# Patient Record
Sex: Female | Born: 1970 | Race: White | Hispanic: No | Marital: Married | Smoking: Never smoker
Health system: Southern US, Community
[De-identification: ages and names within clinical notes are randomized; demographics above are authoritative.]

## PROBLEM LIST (undated history)

## (undated) DIAGNOSIS — E871 Hypo-osmolality and hyponatremia: Secondary | ICD-10-CM

## (undated) DIAGNOSIS — R569 Unspecified convulsions: Secondary | ICD-10-CM

## (undated) DIAGNOSIS — F1010 Alcohol abuse, uncomplicated: Secondary | ICD-10-CM

## (undated) DIAGNOSIS — F32A Depression, unspecified: Secondary | ICD-10-CM

---

## 2021-05-04 ENCOUNTER — Inpatient Hospital Stay (HOSPITAL_COMMUNITY)
Admission: EM | Admit: 2021-05-04 | Discharge: 2021-05-08 | DRG: 082 | Disposition: A | Discharge status: Rehabilitation Facility | Payer: Federal, State, Local not specified - PPO | Attending: Neurosurgery | Admitting: Neurosurgery

## 2021-05-04 ENCOUNTER — Emergency Department (HOSPITAL_COMMUNITY): Payer: Federal, State, Local not specified - PPO

## 2021-05-04 ENCOUNTER — Observation Stay (HOSPITAL_COMMUNITY): Payer: Federal, State, Local not specified - PPO

## 2021-05-04 ENCOUNTER — Encounter (HOSPITAL_COMMUNITY): Payer: Self-pay | Admitting: Neurosurgery

## 2021-05-04 ENCOUNTER — Other Ambulatory Visit: Payer: Self-pay

## 2021-05-04 ENCOUNTER — Observation Stay: Payer: Self-pay

## 2021-05-04 DIAGNOSIS — R569 Unspecified convulsions: Secondary | ICD-10-CM | POA: Diagnosis present

## 2021-05-04 DIAGNOSIS — E440 Moderate protein-calorie malnutrition: Secondary | ICD-10-CM | POA: Insufficient documentation

## 2021-05-04 DIAGNOSIS — S06364A Traumatic hemorrhage of cerebrum, unspecified, with loss of consciousness of 6 hours to 24 hours, initial encounter: Secondary | ICD-10-CM | POA: Diagnosis not present

## 2021-05-04 DIAGNOSIS — E222 Syndrome of inappropriate secretion of antidiuretic hormone: Secondary | ICD-10-CM | POA: Diagnosis present

## 2021-05-04 DIAGNOSIS — E871 Hypo-osmolality and hyponatremia: Secondary | ICD-10-CM

## 2021-05-04 DIAGNOSIS — F10231 Alcohol dependence with withdrawal delirium: Secondary | ICD-10-CM | POA: Diagnosis present

## 2021-05-04 DIAGNOSIS — E876 Hypokalemia: Secondary | ICD-10-CM | POA: Diagnosis present

## 2021-05-04 DIAGNOSIS — F32A Depression, unspecified: Secondary | ICD-10-CM | POA: Diagnosis present

## 2021-05-04 DIAGNOSIS — S02119A Unspecified fracture of occiput, initial encounter for closed fracture: Secondary | ICD-10-CM | POA: Diagnosis present

## 2021-05-04 DIAGNOSIS — I619 Nontraumatic intracerebral hemorrhage, unspecified: Secondary | ICD-10-CM | POA: Diagnosis present

## 2021-05-04 DIAGNOSIS — Z6829 Body mass index (BMI) 29.0-29.9, adult: Secondary | ICD-10-CM

## 2021-05-04 DIAGNOSIS — Z885 Allergy status to narcotic agent status: Secondary | ICD-10-CM

## 2021-05-04 DIAGNOSIS — S06341A Traumatic hemorrhage of right cerebrum with loss of consciousness of 30 minutes or less, initial encounter: Secondary | ICD-10-CM

## 2021-05-04 DIAGNOSIS — F10229 Alcohol dependence with intoxication, unspecified: Secondary | ICD-10-CM | POA: Diagnosis present

## 2021-05-04 DIAGNOSIS — T43224A Poisoning by selective serotonin reuptake inhibitors, undetermined, initial encounter: Secondary | ICD-10-CM | POA: Diagnosis present

## 2021-05-04 DIAGNOSIS — W1830XA Fall on same level, unspecified, initial encounter: Secondary | ICD-10-CM | POA: Diagnosis present

## 2021-05-04 DIAGNOSIS — Y906 Blood alcohol level of 120-199 mg/100 ml: Secondary | ICD-10-CM | POA: Diagnosis present

## 2021-05-04 DIAGNOSIS — G9341 Metabolic encephalopathy: Secondary | ICD-10-CM | POA: Diagnosis present

## 2021-05-04 DIAGNOSIS — Z880 Allergy status to penicillin: Secondary | ICD-10-CM

## 2021-05-04 DIAGNOSIS — Y9301 Activity, walking, marching and hiking: Secondary | ICD-10-CM | POA: Diagnosis present

## 2021-05-04 DIAGNOSIS — S0634AA Traumatic hemorrhage of right cerebrum with loss of consciousness status unknown, initial encounter: Secondary | ICD-10-CM | POA: Diagnosis not present

## 2021-05-04 HISTORY — DX: Alcohol abuse, uncomplicated: F10.10

## 2021-05-04 HISTORY — DX: Unspecified convulsions: R56.9

## 2021-05-04 HISTORY — DX: Hypo-osmolality and hyponatremia: E87.1

## 2021-05-04 HISTORY — DX: Depression, unspecified: F32.A

## 2021-05-04 LAB — OSMOLALITY, URINE: Osmolality, Ur: 439 mOsm/kg (ref 300–900)

## 2021-05-04 LAB — COMPREHENSIVE METABOLIC PANEL
ALT: 20 U/L (ref 0–44)
AST: 29 U/L (ref 15–41)
Albumin: 4.3 g/dL (ref 3.5–5.0)
Alkaline Phosphatase: 89 U/L (ref 38–126)
Anion gap: 19 — ABNORMAL HIGH (ref 5–15)
BUN: 6 mg/dL (ref 6–20)
CO2: 19 mmol/L — ABNORMAL LOW (ref 22–32)
Calcium: 8.7 mg/dL — ABNORMAL LOW (ref 8.9–10.3)
Chloride: 79 mmol/L — ABNORMAL LOW (ref 98–111)
Creatinine, Ser: 0.5 mg/dL (ref 0.44–1.00)
GFR, Estimated: >60 mL/min (ref >60)
Glucose, Bld: 161 mg/dL — ABNORMAL HIGH (ref 70–99)
Potassium: 3.2 mmol/L — ABNORMAL LOW (ref 3.5–5.1)
Sodium: 117 mmol/L — CL (ref 135–145)
Total Bilirubin: 0.9 mg/dL (ref 0.3–1.2)
Total Protein: 6.8 g/dL (ref 6.5–8.1)

## 2021-05-04 LAB — CBC WITH DIFFERENTIAL/PLATELET
Abs Immature Granulocytes: 0.14 10*3/uL — ABNORMAL HIGH (ref 0.00–0.07)
Basophils Absolute: 0.1 10*3/uL (ref 0.0–0.1)
Basophils Relative: 1 %
Eosinophils Absolute: 0.3 10*3/uL (ref 0.0–0.5)
Eosinophils Relative: 2 %
HCT: 35.8 % — ABNORMAL LOW (ref 36.0–46.0)
Hemoglobin: 12.9 g/dL (ref 12.0–15.0)
Immature Granulocytes: 1 %
Lymphocytes Relative: 19 %
Lymphs Abs: 2.3 10*3/uL (ref 0.7–4.0)
MCH: 31.4 pg (ref 26.0–34.0)
MCHC: 36 g/dL (ref 30.0–36.0)
MCV: 87.1 fL (ref 80.0–100.0)
Monocytes Absolute: 0.9 10*3/uL (ref 0.1–1.0)
Monocytes Relative: 7 %
Neutro Abs: 8.2 10*3/uL — ABNORMAL HIGH (ref 1.7–7.7)
Neutrophils Relative %: 70 %
Platelets: 304 10*3/uL (ref 150–400)
RBC: 4.11 MIL/uL (ref 3.87–5.11)
RDW: 11.9 % (ref 11.5–15.5)
WBC: 11.9 10*3/uL — ABNORMAL HIGH (ref 4.0–10.5)
nRBC: 0 % (ref 0.0–0.2)

## 2021-05-04 LAB — BASIC METABOLIC PANEL
Anion gap: 11 (ref 5–15)
Anion gap: 13 (ref 5–15)
BUN: <5 mg/dL — ABNORMAL LOW (ref 6–20)
BUN: 5 mg/dL — ABNORMAL LOW (ref 6–20)
CO2: 23 mmol/L (ref 22–32)
CO2: 22 mmol/L (ref 22–32)
Calcium: 8.9 mg/dL (ref 8.9–10.3)
Calcium: 8.9 mg/dL (ref 8.9–10.3)
Chloride: 79 mmol/L — ABNORMAL LOW (ref 98–111)
Chloride: 79 mmol/L — ABNORMAL LOW (ref 98–111)
Creatinine, Ser: 0.43 mg/dL — ABNORMAL LOW (ref 0.44–1.00)
Creatinine, Ser: 0.53 mg/dL (ref 0.44–1.00)
GFR, Estimated: >60 mL/min (ref >60)
GFR, Estimated: >60 mL/min (ref >60)
Glucose, Bld: 145 mg/dL — ABNORMAL HIGH (ref 70–99)
Glucose, Bld: 153 mg/dL — ABNORMAL HIGH (ref 70–99)
Potassium: 3.9 mmol/L (ref 3.5–5.1)
Potassium: 4.4 mmol/L (ref 3.5–5.1)
Sodium: 113 mmol/L — CL (ref 135–145)
Sodium: 114 mmol/L — CL (ref 135–145)

## 2021-05-04 LAB — MRSA NEXT GEN BY PCR, NASAL: MRSA by PCR Next Gen: NOT DETECTED

## 2021-05-04 LAB — SODIUM
Sodium: 114 mmol/L — CL (ref 135–145)
Sodium: 116 mmol/L — CL (ref 135–145)
Sodium: 117 mmol/L — CL (ref 135–145)

## 2021-05-04 LAB — RAPID URINE DRUG SCREEN, HOSP PERFORMED
Amphetamines: NOT DETECTED
Barbiturates: NOT DETECTED
Benzodiazepines: NOT DETECTED
Cocaine: NOT DETECTED
Opiates: NOT DETECTED
Tetrahydrocannabinol: NOT DETECTED

## 2021-05-04 LAB — HIV ANTIBODY (ROUTINE TESTING W REFLEX): HIV Screen 4th Generation wRfx: NONREACTIVE

## 2021-05-04 LAB — ETHANOL: Alcohol, Ethyl (B): 130 mg/dL — ABNORMAL HIGH (ref <10)

## 2021-05-04 LAB — MAGNESIUM: Magnesium: 1.4 mg/dL — ABNORMAL LOW (ref 1.7–2.4)

## 2021-05-04 LAB — OSMOLALITY: Osmolality: 237 mOsm/kg — CL (ref 275–295)

## 2021-05-04 LAB — GLUCOSE, CAPILLARY: Glucose-Capillary: 130 mg/dL — ABNORMAL HIGH (ref 70–99)

## 2021-05-04 LAB — SODIUM, URINE, RANDOM: Sodium, Ur: 147 mmol/L

## 2021-05-04 LAB — HCG, QUANTITATIVE, PREGNANCY: hCG, Beta Chain, Quant, S: 1 m[IU]/mL (ref <5)

## 2021-05-04 MED ORDER — POTASSIUM CHLORIDE IN NACL 20-0.9 MEQ/L-% IV SOLN
INTRAVENOUS | Status: DC
Start: 2021-05-04 — End: 2021-05-04
  Filled 2021-05-04: qty 1000

## 2021-05-04 MED ORDER — SODIUM CHLORIDE 3 % IV BOLUS
100.0000 mL | Freq: Once | INTRAVENOUS | Status: AC
  Administered 2021-05-04: 100 mL via INTRAVENOUS
  Filled 2021-05-04: qty 500

## 2021-05-04 MED ORDER — THIAMINE HCL 100 MG PO TABS
100.0000 mg | ORAL_TABLET | Freq: Every day | ORAL | Status: DC
  Administered 2021-05-06: 100 mg via ORAL
  Filled 2021-05-04 (×2): qty 1

## 2021-05-04 MED ORDER — ADULT MULTIVITAMIN W/MINERALS CH
1.0000 | ORAL_TABLET | Freq: Every day | ORAL | Status: DC
  Administered 2021-05-06: 1 via ORAL
  Filled 2021-05-04 (×2): qty 1

## 2021-05-04 MED ORDER — LORAZEPAM 2 MG/ML IJ SOLN
0.0000 mg | INTRAMUSCULAR | Status: DC
  Administered 2021-05-04 – 2021-05-05 (×5): 2 mg via INTRAVENOUS
  Filled 2021-05-04 (×6): qty 1

## 2021-05-04 MED ORDER — LEVETIRACETAM IN NACL 500 MG/100ML IV SOLN: 500.0000 mg | Freq: Two times a day (BID) | INTRAVENOUS | Status: DC

## 2021-05-04 MED ORDER — POTASSIUM CHLORIDE 10 MEQ/100ML IV SOLN: 10.0000 meq | INTRAVENOUS | Status: DC

## 2021-05-04 MED ORDER — LORAZEPAM 2 MG/ML IJ SOLN
INTRAMUSCULAR | Status: AC
  Administered 2021-05-04: 2 mg
  Filled 2021-05-04: qty 1

## 2021-05-04 MED ORDER — LORAZEPAM 2 MG/ML IJ SOLN
1.0000 mg | INTRAMUSCULAR | Status: DC | PRN
  Administered 2021-05-04: 2 mg via INTRAVENOUS
  Administered 2021-05-05: 1 mg via INTRAVENOUS
  Administered 2021-05-05: 2 mg via INTRAVENOUS
  Filled 2021-05-04 (×2): qty 1

## 2021-05-04 MED ORDER — MAGNESIUM SULFATE 4 GM/100ML IV SOLN
4.0000 g | Freq: Once | INTRAVENOUS | Status: AC
  Administered 2021-05-04: 4 g via INTRAVENOUS
  Filled 2021-05-04: qty 100

## 2021-05-04 MED ORDER — ONDANSETRON HCL 4 MG/2ML IJ SOLN
4.0000 mg | Freq: Four times a day (QID) | INTRAMUSCULAR | Status: DC | PRN
  Administered 2021-05-04 (×2): 4 mg via INTRAVENOUS
  Filled 2021-05-04: qty 2

## 2021-05-04 MED ORDER — CHLORHEXIDINE GLUCONATE 0.12 % MT SOLN
15.0000 mL | Freq: Two times a day (BID) | OROMUCOSAL | Status: DC
  Administered 2021-05-04 – 2021-05-05 (×4): 15 mL via OROMUCOSAL
  Filled 2021-05-04 (×2): qty 15

## 2021-05-04 MED ORDER — LORAZEPAM 2 MG/ML IJ SOLN
INTRAMUSCULAR | Status: AC
  Administered 2021-05-04: 2 mg via INTRAVENOUS
  Filled 2021-05-04: qty 1

## 2021-05-04 MED ORDER — SODIUM CHLORIDE 0.9 % IV SOLN: INTRAVENOUS | Status: DC

## 2021-05-04 MED ORDER — LEVETIRACETAM IN NACL 1000 MG/100ML IV SOLN
1000.0000 mg | Freq: Two times a day (BID) | INTRAVENOUS | Status: DC
  Administered 2021-05-04 – 2021-05-06 (×6): 1000 mg via INTRAVENOUS
  Filled 2021-05-04 (×6): qty 100

## 2021-05-04 MED ORDER — HYDROCODONE-ACETAMINOPHEN 5-325 MG PO TABS
1.0000 | ORAL_TABLET | ORAL | Status: DC | PRN
  Administered 2021-05-05 – 2021-05-08 (×7): 1 via ORAL
  Filled 2021-05-04 (×7): qty 1

## 2021-05-04 MED ORDER — THIAMINE HCL 100 MG/ML IJ SOLN
100.0000 mg | Freq: Every day | INTRAMUSCULAR | Status: DC
  Administered 2021-05-04: 100 mg via INTRAVENOUS
  Filled 2021-05-04: qty 2

## 2021-05-04 MED ORDER — ACETAMINOPHEN 325 MG PO TABS
650.0000 mg | ORAL_TABLET | ORAL | Status: DC | PRN
  Administered 2021-05-06 (×2): 650 mg via ORAL
  Filled 2021-05-04 (×2): qty 2

## 2021-05-04 MED ORDER — FOLIC ACID 1 MG PO TABS
1.0000 mg | ORAL_TABLET | Freq: Every day | ORAL | Status: DC
  Administered 2021-05-05: 1 mg via ORAL
  Filled 2021-05-04: qty 1

## 2021-05-04 MED ORDER — SODIUM CHLORIDE 3 % IV SOLN
INTRAVENOUS | Status: DC
  Filled 2021-05-04 (×2): qty 500

## 2021-05-04 MED ORDER — CHLORHEXIDINE GLUCONATE CLOTH 2 % EX PADS
6.0000 | MEDICATED_PAD | Freq: Every day | CUTANEOUS | Status: DC
  Administered 2021-05-04 – 2021-05-07 (×5): 6 via TOPICAL

## 2021-05-04 MED ORDER — SODIUM CHLORIDE 0.9 % IV BOLUS: 1000.0000 mL | Freq: Once | INTRAVENOUS | Status: DC

## 2021-05-04 MED ORDER — LACTATED RINGERS IV BOLUS: 1000.0000 mL | Freq: Once | INTRAVENOUS | Status: DC

## 2021-05-04 MED ORDER — LORAZEPAM 1 MG PO TABS: 1.0000 mg | ORAL_TABLET | ORAL | Status: DC | PRN

## 2021-05-04 MED ORDER — ONDANSETRON HCL 4 MG PO TABS: 4.0000 mg | ORAL_TABLET | Freq: Four times a day (QID) | ORAL | Status: DC | PRN

## 2021-05-04 MED ORDER — SODIUM CHLORIDE 0.9% FLUSH: 10.0000 mL | INTRAVENOUS | Status: DC | PRN

## 2021-05-04 MED ORDER — LORAZEPAM 2 MG/ML IJ SOLN: 0.0000 mg | Freq: Three times a day (TID) | INTRAMUSCULAR | Status: DC

## 2021-05-04 MED ORDER — ORAL CARE MOUTH RINSE
15.0000 mL | Freq: Two times a day (BID) | OROMUCOSAL | Status: DC
  Administered 2021-05-04 – 2021-05-05 (×3): 15 mL via OROMUCOSAL

## 2021-05-04 MED ORDER — LORAZEPAM 2 MG/ML IJ SOLN: 2.0000 mg | Freq: Once | INTRAMUSCULAR | Status: AC

## 2021-05-04 MED ORDER — SODIUM CHLORIDE 0.9% FLUSH
10.0000 mL | Freq: Two times a day (BID) | INTRAVENOUS | Status: DC
  Administered 2021-05-04 – 2021-05-06 (×5): 10 mL
  Administered 2021-05-07: 30 mL
  Administered 2021-05-08: 10 mL
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
## 2021-05-04 NOTE — Progress Notes (Signed)
Date and time results received: 05/04/21 0855 ? ? ?Test: Na ?Critical Value: 113 ? ?Name of Provider Notified: Dr. Kathyrn Sheriff ? ?Orders Received? Or Actions Taken?:  MD to bedside ?

## 2021-05-04 NOTE — ED Triage Notes (Signed)
Pt arrives to ED BIB [facility] due to a Syncopal Episode. Per EMS pt had a syncopal episode, hit her head on the pavement and began "shaking" with snoring respirations that was witnessed by husband. Per the husband pt had 8 beers, no Hx of Seizures and is on Zoloft. ?

## 2021-05-04 NOTE — ED Notes (Signed)
CT has been called and made aware that pt needs scans done. ?
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
## 2021-05-04 NOTE — H&P (Signed)
?  Chief Complaint  ? ?Chief Complaint  ?Patient presents with  ? Loss of Consciousness  ? ? ?History of Present Illness  ?Peggy Vega is a 51 y.o. female brought into the emergency department by her husband after suffering a fall.  Patient was apparently out, somewhat intoxicated and was walking and fell backwards hitting the back of her head.  She apparently lost consciousness and had a possiblewitnessed seizure in which she was "shaking" for a few seconds.  Patient was therefore brought to the emergency department.  Upon her arrival she was awake, moving all extremities, although not following commands or interactive verbally.  CT scan was completed demonstrating an intraparenchymal hematoma.  Neurosurgical consultation was therefore requested. ? ?Of note, the patient's husband does not report any history of medical problems denying HTN, DM, other endocrine disorders, hypothyroidism, heart disease or stroke. He only knows of Zoloft as a daily rx med.  ? ?Past Medical History  ?No past medical history on file. ? ?Past Surgical History  ?Orthopaedic surgeries ? ?Social History  ?  ? ?Medications  ? ?Prior to Admission medications   ?Not on File  ? ? ?Allergies  ? ?Allergies  ?Allergen Reactions  ? Penicillins Other (See Comments)  ?  UNKOWN  ? Morphine Rash  ? ? ?Review of Systems  ?ROS ? ?Neurologic Exam  ?Arouses to voice ?Pupils reactive ?Does not track ?No verbal responses ?Not following commands ?Moves all extremities spontaneously, localizes briskly ? ?Imaging  ?CT scan of the head was personally reviewed and demonstrates a right frontal intraparenchymal hematoma.  There is no significant local mass effect, and no effacement of the lateral ventricle.  There is no hydrocephalus.  There is a nondisplaced linear fracture through the right occipital bone and overlying scalp hematoma. ? ?Impression  ?- 51 y.o. female intoxicated and status post fall.  Her neurologic exam appears more depressed than would be  expected for the right frontal hematoma.  Suspect also related to postictal state, as well as alcohol intoxication.  Pt is also significantly hyponeatremic ? ?Plan  ?-We will admit to the intensive care unit for close neurologic observation ?-Plan on repeat head CT this am ?-Keppra 500 mg twice daily ?- Will get EEG ?-Will check serum Osm and urine lytes ?-Start hypertonic saline at 1cc/kg/hr, get Q4hr serum Na ?-Will place PICC for hypertonic and frequent routine labs ? ? ?I have reviewed the situation with the patient's husband at bedside. All questions were answered. ? ?Consuella Lose, MD ?[facility]  ? ?

## 2021-05-04 NOTE — Procedures (Signed)
Patient Name: Peggy Vega  ?MRN: [id number]  ?Epilepsy Attending: Charlsie Quest  ?Referring Physician/Provider: Lisbeth Renshaw, MD ?Date:  05/04/2021 ?Duration: 25.11 mins ? ?Patient history: - 51 y.o. female intoxicated and status post fall.  Her neurologic exam appears more depressed than would be expected for the right frontal hematoma.  Suspect also related to postictal state, as well as alcohol intoxication. EEG to evaluate for seizure ? ?Level of alertness: Awake ? ?AEDs during EEG study: LEV, Ativan ? ?Technical aspects: This EEG study was done with scalp electrodes positioned according to the 10-20 International system of electrode placement. Electrical activity was acquired at a sampling rate of 500Hz  and reviewed with a high frequency filter of 70Hz  and a low frequency filter of 1Hz . EEG data were recorded continuously and digitally stored.  ? ?Description: No posterior dominant rhythm was seen. EEG showed continuous generalized 3 to 6 Hz theta-delta slowing. Hyperventilation and photic stimulation were not performed.    ? ?Of note, eeg was technically difficult due to significant movement and artifact.  ? ?ABNORMALITY ?- Continuous slow, generalized ? ?IMPRESSION: ?This technically difficult study is suggestive of moderate diffuse encephalopathy, nonspecific etiology. No seizures or epileptiform discharges were seen throughout the recording. ? ?  ? ?

## 2021-05-04 NOTE — Progress Notes (Signed)
En route to CT in 4N ICU hallway, patient had a generalized tonic clonic seizure for approximately 1 minute and 20 seconds. Patient brought back to room, Dr. Conchita Paris at bedside immediately, Ativan 2mg  IV given. CCM consulted and at bedside. Vitals stables, airway maintained at this time spo2 95% on room air. Patient post ictal at present, somnolent, snoring. CT to be completed at later time. Hypertonic bolus given per order. Husband updated at bedside by MDs. EEG tech en route.   ? ?Blood pressure (!) 163/86, pulse 95, temperature 97.7 ?F (36.5 ?C), resp. rate (!) 27, height 5\' 4"  (1.626 m), weight 76.9 kg, SpO2 97 %.  ?

## 2021-05-04 NOTE — Progress Notes (Signed)
Date and time results received: 05/04/21 1214 ? ? ?Test: Serum Osmolality ?Critical Value: 237 ? ?Name of Provider Notified: CCM Dr. Pablo Lawrence ? ?Orders Received? Or Actions Taken?:  ?No further orders at present ?

## 2021-05-04 NOTE — Progress Notes (Signed)
Eeg complete. Pending results ?

## 2021-05-04 NOTE — ED Notes (Addendum)
Report given to Walters, RN (4N). ? ?

## 2021-05-04 NOTE — Progress Notes (Signed)
Date and time results received: 05/04/21 1400 ? ? ?Test: Na ?Critical Value: 114 ? ?Name of Provider Notified: CCM Dr. Kendrick Fries ? ?Orders Received? Or Actions Taken?:  ?No new orders.  ?

## 2021-05-04 NOTE — Progress Notes (Signed)
Date and time results received: 05/04/21 1111 ? ? ?Test: Na ?Critical Value: 114 ? ?Name of Provider Notified: Dr. Kendrick Fries, NP Merry Proud ? ?Orders Received? Or Actions Taken?: Restart hypertonic saline at 35cc/hr.  ?

## 2021-05-04 NOTE — Progress Notes (Signed)
Peripherally Inserted Central Catheter Placement ? ?The IV Nurse has discussed with the patient and/or persons authorized to consent for the patient, the purpose of this procedure and the potential benefits and risks involved with this procedure.  The benefits include less needle sticks, lab draws from the catheter, and the patient may be discharged home with the catheter. Risks include, but not limited to, infection, bleeding, blood clot (thrombus formation), and puncture of an artery; nerve damage and irregular heartbeat and possibility to perform a PICC exchange if needed/ordered by physician.  Alternatives to this procedure were also discussed.  Bard Power PICC patient education guide, fact sheet on infection prevention and patient information card has been provided to patient /or left at bedside. Consent from husband obtained from the staff nurse. ? ?PICC Placement Documentation  ?PICC Double Lumen 123456 Right Basilic 35 cm 0 cm (Active)  ?Indication for Insertion or Continuance of Line Administration of hyperosmolar/irritating solutions (i.e. TPN, Vancomycin, etc.);Limited venous access - need for IV therapy >5 days (PICC only);Poor Vasculature-patient has had multiple peripheral attempts or PIVs lasting less than 24 hours 05/04/21 1502  ?Exposed Catheter (cm) 0 cm 05/04/21 1502  ?Site Assessment Clean, Dry, Intact 05/04/21 1502  ?Lumen #1 Status Flushed;Saline locked;Blood return noted 05/04/21 1502  ?Lumen #2 Status Flushed;Saline locked;Blood return noted 05/04/21 1502  ?Dressing Type Securing device;Transparent 05/04/21 1502  ?Dressing Status Antimicrobial disc in place;Clean, Dry, Intact 05/04/21 1502  ?Safety Lock Not Applicable [id number]  ?Line Care Connections checked and tightened 05/04/21 1502  ?Line Adjustment (NICU/IV Team Only) No 05/04/21 1502  ?Dressing Intervention New dressing 05/04/21 1502  ?Dressing Change Due 05/11/21 05/04/21 1502  ? ? ? ? ? ?Rolena Infante ?05/04/2021, 3:03  PM ? ?

## 2021-05-04 NOTE — ED Notes (Signed)
Pt had bowel movement and began to move around in bed, attempted to remove C-Collar. This RN and Maurice March, EMT cleaned pt, clean sheets, clean gown provided. C-Collar repositioned correctly. Warm blankets applied.  ?

## 2021-05-04 NOTE — Progress Notes (Addendum)
PT Cancellation Note ? ?Patient Details ?Name: Haleema Vanderheyden ?MRN: [id number] ?DOB: 1970/06/15 ? ? ?Cancelled Treatment:    Reason Eval/Treat Not Completed: Medical issues which prohibited therapy. Pt hyponatremic, preparing to start hypertonic saline solution and awaiting head CT. PT will follow up when more medically stable and appropriate to mobilize. ? ? ?Arlyss Gandy ?05/04/2021, 10:23 AM ?

## 2021-05-05 ENCOUNTER — Inpatient Hospital Stay (HOSPITAL_COMMUNITY): Payer: Federal, State, Local not specified - PPO

## 2021-05-05 DIAGNOSIS — Y906 Blood alcohol level of 120-199 mg/100 ml: Secondary | ICD-10-CM | POA: Diagnosis present

## 2021-05-05 DIAGNOSIS — S06364A Traumatic hemorrhage of cerebrum, unspecified, with loss of consciousness of 6 hours to 24 hours, initial encounter: Secondary | ICD-10-CM | POA: Diagnosis not present

## 2021-05-05 DIAGNOSIS — S0291XD Unspecified fracture of skull, subsequent encounter for fracture with routine healing: Secondary | ICD-10-CM | POA: Diagnosis not present

## 2021-05-05 DIAGNOSIS — E222 Syndrome of inappropriate secretion of antidiuretic hormone: Secondary | ICD-10-CM | POA: Diagnosis present

## 2021-05-05 DIAGNOSIS — Y9301 Activity, walking, marching and hiking: Secondary | ICD-10-CM | POA: Diagnosis present

## 2021-05-05 DIAGNOSIS — T43224A Poisoning by selective serotonin reuptake inhibitors, undetermined, initial encounter: Secondary | ICD-10-CM | POA: Diagnosis present

## 2021-05-05 DIAGNOSIS — Z6829 Body mass index (BMI) 29.0-29.9, adult: Secondary | ICD-10-CM | POA: Diagnosis not present

## 2021-05-05 DIAGNOSIS — S0636AD Traumatic hemorrhage of cerebrum, unspecified, with loss of consciousness status unknown, subsequent encounter: Secondary | ICD-10-CM | POA: Diagnosis not present

## 2021-05-05 DIAGNOSIS — E440 Moderate protein-calorie malnutrition: Secondary | ICD-10-CM | POA: Diagnosis present

## 2021-05-05 DIAGNOSIS — Z880 Allergy status to penicillin: Secondary | ICD-10-CM | POA: Diagnosis not present

## 2021-05-05 DIAGNOSIS — S06340S Traumatic hemorrhage of right cerebrum without loss of consciousness, sequela: Secondary | ICD-10-CM | POA: Diagnosis not present

## 2021-05-05 DIAGNOSIS — F1010 Alcohol abuse, uncomplicated: Secondary | ICD-10-CM | POA: Diagnosis not present

## 2021-05-05 DIAGNOSIS — K5901 Slow transit constipation: Secondary | ICD-10-CM | POA: Diagnosis not present

## 2021-05-05 DIAGNOSIS — F32A Depression, unspecified: Secondary | ICD-10-CM | POA: Diagnosis present

## 2021-05-05 DIAGNOSIS — W1830XA Fall on same level, unspecified, initial encounter: Secondary | ICD-10-CM | POA: Diagnosis present

## 2021-05-05 DIAGNOSIS — S069X9D Unspecified intracranial injury with loss of consciousness of unspecified duration, subsequent encounter: Secondary | ICD-10-CM | POA: Diagnosis not present

## 2021-05-05 DIAGNOSIS — G9341 Metabolic encephalopathy: Secondary | ICD-10-CM | POA: Diagnosis present

## 2021-05-05 DIAGNOSIS — S02119A Unspecified fracture of occiput, initial encounter for closed fracture: Secondary | ICD-10-CM | POA: Diagnosis present

## 2021-05-05 DIAGNOSIS — S0634AA Traumatic hemorrhage of right cerebrum with loss of consciousness status unknown, initial encounter: Secondary | ICD-10-CM | POA: Diagnosis present

## 2021-05-05 DIAGNOSIS — R569 Unspecified convulsions: Secondary | ICD-10-CM | POA: Diagnosis present

## 2021-05-05 DIAGNOSIS — Z885 Allergy status to narcotic agent status: Secondary | ICD-10-CM | POA: Diagnosis not present

## 2021-05-05 DIAGNOSIS — E876 Hypokalemia: Secondary | ICD-10-CM | POA: Diagnosis present

## 2021-05-05 DIAGNOSIS — E871 Hypo-osmolality and hyponatremia: Secondary | ICD-10-CM | POA: Diagnosis not present

## 2021-05-05 DIAGNOSIS — F10229 Alcohol dependence with intoxication, unspecified: Secondary | ICD-10-CM | POA: Diagnosis present

## 2021-05-05 DIAGNOSIS — F10231 Alcohol dependence with withdrawal delirium: Secondary | ICD-10-CM | POA: Diagnosis present

## 2021-05-05 LAB — MAGNESIUM
Magnesium: 1.9 mg/dL (ref 1.7–2.4)
Magnesium: 1.8 mg/dL (ref 1.7–2.4)
Magnesium: 1.8 mg/dL (ref 1.7–2.4)

## 2021-05-05 LAB — CBC
HCT: 35.9 % — ABNORMAL LOW (ref 36.0–46.0)
Hemoglobin: 13.2 g/dL (ref 12.0–15.0)
MCH: 31.7 pg (ref 26.0–34.0)
MCHC: 36.8 g/dL — ABNORMAL HIGH (ref 30.0–36.0)
MCV: 86.1 fL (ref 80.0–100.0)
Platelets: 261 10*3/uL (ref 150–400)
RBC: 4.17 MIL/uL (ref 3.87–5.11)
RDW: 12.2 % (ref 11.5–15.5)
WBC: 10.3 10*3/uL (ref 4.0–10.5)
nRBC: 0 % (ref 0.0–0.2)

## 2021-05-05 LAB — COMPREHENSIVE METABOLIC PANEL
ALT: 23 U/L (ref 0–44)
AST: 37 U/L (ref 15–41)
Albumin: 3.6 g/dL (ref 3.5–5.0)
Alkaline Phosphatase: 79 U/L (ref 38–126)
Anion gap: 9 (ref 5–15)
BUN: 5 mg/dL — ABNORMAL LOW (ref 6–20)
CO2: 22 mmol/L (ref 22–32)
Calcium: 8.7 mg/dL — ABNORMAL LOW (ref 8.9–10.3)
Chloride: 91 mmol/L — ABNORMAL LOW (ref 98–111)
Creatinine, Ser: 0.41 mg/dL — ABNORMAL LOW (ref 0.44–1.00)
GFR, Estimated: >60 mL/min (ref >60)
Glucose, Bld: 122 mg/dL — ABNORMAL HIGH (ref 70–99)
Potassium: 3.4 mmol/L — ABNORMAL LOW (ref 3.5–5.1)
Sodium: 122 mmol/L — ABNORMAL LOW (ref 135–145)
Total Bilirubin: 0.9 mg/dL (ref 0.3–1.2)
Total Protein: 6.1 g/dL — ABNORMAL LOW (ref 6.5–8.1)

## 2021-05-05 LAB — CORTISOL: Cortisol, Plasma: 21.7 ug/dL

## 2021-05-05 LAB — PHOSPHORUS
Phosphorus: 2.5 mg/dL (ref 2.5–4.6)
Phosphorus: 2.9 mg/dL (ref 2.5–4.6)

## 2021-05-05 LAB — SODIUM
Sodium: 122 mmol/L — ABNORMAL LOW (ref 135–145)
Sodium: 124 mmol/L — ABNORMAL LOW (ref 135–145)
Sodium: 127 mmol/L — ABNORMAL LOW (ref 135–145)

## 2021-05-05 MED ORDER — DEXMEDETOMIDINE HCL IN NACL 400 MCG/100ML IV SOLN
0.4000 ug/kg/h | INTRAVENOUS | Status: DC
  Administered 2021-05-05 – 2021-05-06 (×3): 0.4 ug/kg/h via INTRAVENOUS
  Filled 2021-05-05 (×2): qty 100

## 2021-05-05 MED ORDER — SODIUM CHLORIDE 0.9 % IV SOLN
1.0000 mg | Freq: Every day | INTRAVENOUS | Status: DC
  Filled 2021-05-05: qty 0.2

## 2021-05-05 MED ORDER — PROSOURCE TF PO LIQD
45.0000 mL | Freq: Every day | ORAL | Status: DC
  Administered 2021-05-05: 45 mL
  Filled 2021-05-05: qty 45

## 2021-05-05 MED ORDER — POTASSIUM CHLORIDE 10 MEQ/50ML IV SOLN
10.0000 meq | INTRAVENOUS | Status: AC
  Administered 2021-05-05 (×4): 10 meq via INTRAVENOUS
  Filled 2021-05-05 (×4): qty 50

## 2021-05-05 MED ORDER — SODIUM CHLORIDE 0.9 % IV SOLN: INTRAVENOUS | Status: DC

## 2021-05-05 MED ORDER — MAGNESIUM SULFATE 2 GM/50ML IV SOLN
2.0000 g | Freq: Once | INTRAVENOUS | Status: AC
  Administered 2021-05-05: 2 g via INTRAVENOUS
  Filled 2021-05-05: qty 50

## 2021-05-05 MED ORDER — OSMOLITE 1.5 CAL PO LIQD
1000.0000 mL | ORAL | Status: DC
  Administered 2021-05-05 – 2021-05-06 (×3): 1000 mL

## 2021-05-05 NOTE — Progress Notes (Signed)
Initial Nutrition Assessment ? ?DOCUMENTATION CODES:  ? ?Non-severe (moderate) malnutrition in context of social or environmental circumstances ? ?INTERVENTION:  ? ?Initiate tube feeds via Cortrak: ?- Start Osmolite 1.5 @ 20 ml/hr and advance by 10 ml q 6 hours to goal rate of 55 ml/hr (1320 ml/day) ?- ProSource TF 45 ml daily ? ?Tube feeding regimen at goal rate provides 2020 kcal, 94 grams of protein, and 1006 ml of H2O.  ? ?Monitor magnesium, potassium, and phosphorus BID for at least 3 days, MD to replete as needed, as pt is at risk for refeeding syndrome given EtOH abuse. ? ?- Continue MVI with minerals daily per tube ? ?NUTRITION DIAGNOSIS:  ? ?Moderate Malnutrition related to social / environmental circumstances (EtOH abuse) as evidenced by mild fat depletion, moderate muscle depletion. ? ?GOAL:  ? ?Patient will meet greater than or equal to 90% of their needs ? ?MONITOR:  ? ?Diet advancement, Labs, Weight trends, TF tolerance ? ?REASON FOR ASSESSMENT:  ? ?Consult ?Enteral/tube feeding initiation and management ? ?ASSESSMENT:  ? ?51 year old female who presented to the ED on 3/26 after syncopal episode and seizure-like activity after a fall. Pt had consumed 8-9 beers. PMH of EtOH abuse, depression. Pt found to have traumatic R frontal hematoma and R occipital fracture. ? ?03/27 - SLP recommending NPO, Cortrak placed (tip gastric per Cortrak team) ? ?Consult received for tube feeding initiation and management. Cortrak placed today, tip gastric per Cortrak team. Abdominal x-ray pending. ? ?No weight history available in chart and no family present at time of RD visit. Will attempt to obtain diet and weight history on follow-up. ? ?Based on NFPE, pt meets criteria for malnutrition. Given malnutrition and heavy EtOH abuse, pt at refeeding risk. Labs ordered. ? ?Medications reviewed and include: IV ativan, MVI with minerals, thiamine, IV KCl 10 mEq x 4 runs, IV folic acid ?IVF: NS @ 75 ml/hr ? ?Labs reviewed:  sodium 124, potassium 3.4 ?CBG's: 130 ? ?UOP: 850 ml x 12 hours ?I/O's: +1.4 L since admit ? ?NUTRITION - FOCUSED PHYSICAL EXAM: ? ?Flowsheet Row Most Recent Value  ?Orbital Region Mild depletion  ?Upper Arm Region Mild depletion  ?Thoracic and Lumbar Region Mild depletion  ?Buccal Region Mild depletion  ?Temple Region Mild depletion  ?Clavicle Bone Region Moderate depletion  ?Clavicle and Acromion Bone Region Moderate depletion  ?Scapular Bone Region Unable to assess  ?Dorsal Hand Mild depletion  ?Patellar Region Mild depletion  ?Anterior Thigh Region Moderate depletion  ?Posterior Calf Region Moderate depletion  ?Edema (RD Assessment) None  ?Hair Reviewed  ?Eyes Reviewed  ?Mouth Reviewed  ?Skin Reviewed  ?Nails Reviewed  ? ?  ? ? ?Diet Order:   ?Diet Order   ? ? None  ? ?  ? ? ?EDUCATION NEEDS:  ? ?Not appropriate for education at this time ? ?Skin:  Skin Assessment: Reviewed RN Assessment ? ?Last BM:  05/04/21 ? ?Height:  ? ?Ht Readings from Last 1 Encounters:  ?05/04/21 5\' 4"  (1.626 m)  ? ? ?Weight:  ? ?Wt Readings from Last 1 Encounters:  ?05/04/21 76.9 kg  ? ? ?BMI:  Body mass index is 29.1 kg/m?. ? ?Estimated Nutritional Needs:  ? ?Kcal:  1800-2000 ? ?Protein:  85-100 grams ? ?Fluid:  1.8-2.0 L ? ? ? ?05/06/21, MS, RD, LDN ?Inpatient Clinical Dietitian ?Please see AMiON for contact information. ? ?

## 2021-05-05 NOTE — Progress Notes (Signed)
?  NEUROSURGERY PROGRESS NOTE  ? ?No issues overnight. Pt agitated in bed currently. ? ?EXAM:  ?BP (!) 143/82   Pulse 98   Temp 99.3 ?F (37.4 ?C) (Axillary)   Resp 17   Ht 5\' 4"  (1.626 m)   Wt 76.9 kg   SpO2 100%   BMI 29.10 kg/m?  ? ?Awake, alert, oriented to person ?CN grossly intact  ?5/5 BUE/BLE  ? ?IMPRESSION:  ?51 y.o. female s/p fall with right frontal contusion and postop SZ. Mental status significantly improved from yesterday ?- Severe hyponatremia improving ? ?PLAN: ?- Cont Keppra ?- Cont NS, hypertonic d/c'ed, trend Na ?- Cont PT/OT/SLP  ? ? ?54, MD ?[facility]  ? ?
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
## 2021-05-05 NOTE — Progress Notes (Signed)
[facility] ADULT ICU REPLACEMENT PROTOCOL ? ? ?The patient does apply for the [facility] Adult ICU Electrolyte Replacment Protocol based on the criteria listed below:  ? ?1.Exclusion criteria: TCTS patients, ECMO patients, and Dialysis patients ?2. Is GFR >/= 30 ml/min? Yes.    ?Patient's GFR today is >60 ?3. Is SCr </= 2? Yes.   ?Patient's SCr is 0.41 mg/dL ?4. Did SCr increase >/= 0.5 in 24 hours? No. ?5.Pt's weight >40kg  Yes.   ?6. Abnormal electrolyte(s): K, Mag  ?7. Electrolytes replaced per protocol ?8.  Call MD STAT for K+ </= 2.5, Phos </= 1, or Mag </= 1 ?Physician:  Arsenio Loader ? ?Ishanvi Mcquitty E Joely Losier 05/05/2021 5:38 AM  ?

## 2021-05-05 NOTE — Progress Notes (Signed)
PT Cancellation Note ? ?Patient Details ?Name: Peggy Vega ?MRN: [id number] ?DOB: 08-10-1970 ? ? ?Cancelled Treatment:    Reason Eval/Treat Not Completed: Medical issues which prohibited therapy;Fatigue/lethargy limiting ability to participate - Secondary to lethargy, suspect due to medication. PT to follow up acutely as able.  ? ?Thermon Leyland, SPT ?Acute Rehab Services ? ? ? ?Thermon Leyland ?05/05/2021, 12:12 PM ?

## 2021-05-05 NOTE — Evaluation (Signed)
Clinical/Bedside Swallow Evaluation ?Patient Details  ?Name: Peggy Vega ?MRN: [id number] ?Date of Birth: February 05, 1971 ? ?Today's Date: 05/05/2021 ?Time: SLP Start Time (ACUTE ONLY): 1302 SLP Stop Time (ACUTE ONLY): 1317 ?SLP Time Calculation (min) (ACUTE ONLY): 15 min ? ?Past Medical History:  ?Past Medical History:  ?Diagnosis Date  ? Depression   ? ETOH abuse   ? Hyponatremia   ? Seizure (HCC)   ? ?Past Surgical History: History reviewed. No pertinent surgical history. ?HPI:  ?Pt is a 51 y.o. female who was brought to the ET by her husband after suffering a fall. Per H&P, pt was somewhat intoxicated, fell backwards while walking, hit the back of her head, lost consciousness, and had a witnessed seizure. Imaging revealed right frontal intraparenchymal hematoma, non-displaced right occipital skull fracture with large right posterior scalp hematoma, no acute cervical spine fracture on CT of the head/neck.  EEG 3/26: moderate diffuse encephalopathy, nonspecific etiology. No seizures or epileptiform discharges.  ?  ?Assessment / Plan / Recommendation  ?Clinical Impression ? Pt was seen for bedside swallow evaluation with her husband and close family friend present. They denied the pt having any history of dysphagia. Pt was mostly slightly lethargic or somewhat agitated and attempting to get out of the bed. Pt's husband reported that the pt is "stubborn" at baseline and that this is acutely exacerbated. Oral mechanism exam was limited due to pt's difficulty following/unwillingness to follow commands; however, oral motor strength and ROM appeared grossly [facility] and she presented with adequate, natural dentition. Pt accepted a single 1/2 tsp of puree, drops of thin liquids, and trace amounts of puree that were left on her lips. Despite cueing, pt demonstrated biting when the straw was presented and resisted all other boluses by shaking her head when they were presented and by saying, "no". It is recommended that the pt's  NPO status be maintained and that short-term non-oral alimentation be initiated. Prognosis for diet initiation is judged to be good with improvement in mentation. SLP will follow to assess improvement. ?SLP Visit Diagnosis: Dysphagia, unspecified (R13.10) ?   ?Aspiration Risk ? Mild aspiration risk  ?  ?Diet Recommendation Alternative means - temporary  ? ?Medication Administration: Via alternative means (critical meds may be crushed and given with puree, but cooperation therewith is questioned.)  ?  ?Other  Recommendations Oral Care Recommendations: Oral care BID   ? ?Recommendations for follow up therapy are one component of a multi-disciplinary discharge planning process, led by the attending physician.  Recommendations may be updated based on patient status, additional functional criteria and insurance authorization. ? ?Follow up Recommendations  (TBD)  ? ? ?  ?Assistance Recommended at Discharge Frequent or constant Supervision/Assistance  ?Functional Status Assessment Patient has had a recent decline in their functional status and demonstrates the ability to make significant improvements in function in a reasonable and predictable amount of time.  ?Frequency and Duration min 2x/week  ?2 weeks ?  ?   ? ?Prognosis Prognosis for Safe Diet Advancement: Good ?Barriers to Reach Goals: Cognitive deficits  ? ?  ? ?Swallow Study   ?General Date of Onset: 05/04/21 ?HPI: Pt is a 51 y.o. female who was brought to the ET by her husband after suffering a fall. Per H&P, pt was somewhat intoxicated, fell backwards while walking, hit the back of her head, lost consciousness, and had a witnessed seizure. Imaging revealed right frontal intraparenchymal hematoma, non-displaced right occipital skull fracture with large right posterior scalp hematoma, no acute cervical spine  fracture on CT of the head/neck.  EEG 3/26: moderate diffuse encephalopathy, nonspecific etiology. No seizures or epileptiform discharges. ?Type of Study:  Bedside Swallow Evaluation ?Previous Swallow Assessment: none ?Diet Prior to this Study: NPO ?Temperature Spikes Noted: No ?Respiratory Status: Room air ?History of Recent Intubation: No ?Behavior/Cognition: Alert;Confused;Uncooperative ?Oral Cavity Assessment: Within Functional Limits ?Oral Care Completed by SLP: No ?Oral Cavity - Dentition: Adequate natural dentition ?Self-Feeding Abilities: Total assist ?Patient Positioning: Upright in bed;Postural control adequate for testing ?Baseline Vocal Quality: Normal (limited verbal output) ?Volitional Cough: Cognitively unable to elicit ?Volitional Swallow: Unable to elicit  ?  ?Oral/Motor/Sensory Function Overall Oral Motor/Sensory Function:  (Seems grossly WNL, but assesment limited)   ?Ice Chips Ice chips: Not tested (pt refused)   ?Thin Liquid Thin Liquid: Not tested (pt refused; only trace amounts (i.e., drops) accepted)  ?  ?Nectar Thick Nectar Thick Liquid: Not tested   ?Honey Thick Honey Thick Liquid: Not tested   ?Puree Puree: Within functional limits ?Presentation: Spoon   ?Solid ? ? ?  Solid: Not tested (pt refused)  ? ?  ?Cody Oliger I. Vear Clock, MS, CCC-SLP ?Acute Rehabilitation Services ?Office number [phone] ?Pager [phone] ? ?Scheryl Marten ?05/05/2021,1:34 PM ? ? ? ? ? ? ?

## 2021-05-05 NOTE — Procedures (Signed)
Cortrak ? ?Person Inserting Tube:  Debbe Mounts, RD ?Tube Size:  10 ?Tube Location:  Left nare ?Secured by: Wasatch Callas ?Technique Used to Measure Tube Placement:  Marking at nare/corner of mouth ?Cortrak Secured At:  57 cm ? ?Cortrak Tube Team Note: ? ?Consult received to place a Cortrak feeding tube.  ? ?X-ray is required, abdominal x-ray has been ordered by the Cortrak team. Please confirm tube placement before using the Cortrak tube.  ? ?If the tube becomes dislodged please keep the tube and contact the Cortrak team at [email or web address] (password [email or web address]) for replacement.  ?If after hours and replacement cannot be delayed, place a NG tube and confirm placement with an abdominal x-ray.  ? ? ?Greig Castilla, RD, LDN ?Clinical Dietitian ?RD pager # available in AMION  ?After hours/weekend pager # available in AMION ? ? ?

## 2021-05-06 DIAGNOSIS — E440 Moderate protein-calorie malnutrition: Secondary | ICD-10-CM | POA: Insufficient documentation

## 2021-05-06 DIAGNOSIS — S06364A Traumatic hemorrhage of cerebrum, unspecified, with loss of consciousness of 6 hours to 24 hours, initial encounter: Secondary | ICD-10-CM | POA: Diagnosis not present

## 2021-05-06 DIAGNOSIS — E871 Hypo-osmolality and hyponatremia: Secondary | ICD-10-CM | POA: Diagnosis not present

## 2021-05-06 DIAGNOSIS — R569 Unspecified convulsions: Secondary | ICD-10-CM | POA: Diagnosis not present

## 2021-05-06 LAB — CBC
HCT: 34.9 % — ABNORMAL LOW (ref 36.0–46.0)
Hemoglobin: 12.2 g/dL (ref 12.0–15.0)
MCH: 31.7 pg (ref 26.0–34.0)
MCHC: 35 g/dL (ref 30.0–36.0)
MCV: 90.6 fL (ref 80.0–100.0)
Platelets: 230 10*3/uL (ref 150–400)
RBC: 3.85 MIL/uL — ABNORMAL LOW (ref 3.87–5.11)
RDW: 12.5 % (ref 11.5–15.5)
WBC: 6.8 10*3/uL (ref 4.0–10.5)
nRBC: 0 % (ref 0.0–0.2)

## 2021-05-06 LAB — SODIUM
Sodium: 129 mmol/L — ABNORMAL LOW (ref 135–145)
Sodium: 132 mmol/L — ABNORMAL LOW (ref 135–145)

## 2021-05-06 LAB — BASIC METABOLIC PANEL
Anion gap: 7 (ref 5–15)
BUN: 5 mg/dL — ABNORMAL LOW (ref 6–20)
CO2: 22 mmol/L (ref 22–32)
Calcium: 8.6 mg/dL — ABNORMAL LOW (ref 8.9–10.3)
Chloride: 102 mmol/L (ref 98–111)
Creatinine, Ser: 0.41 mg/dL — ABNORMAL LOW (ref 0.44–1.00)
GFR, Estimated: >60 mL/min (ref >60)
Glucose, Bld: 125 mg/dL — ABNORMAL HIGH (ref 70–99)
Potassium: 3.3 mmol/L — ABNORMAL LOW (ref 3.5–5.1)
Sodium: 131 mmol/L — ABNORMAL LOW (ref 135–145)

## 2021-05-06 LAB — MAGNESIUM: Magnesium: 1.9 mg/dL (ref 1.7–2.4)

## 2021-05-06 LAB — PHOSPHORUS: Phosphorus: 2.9 mg/dL (ref 2.5–4.6)

## 2021-05-06 MED ORDER — POTASSIUM CHLORIDE 20 MEQ PO PACK
20.0000 meq | PACK | ORAL | Status: DC
  Administered 2021-05-06: 20 meq
  Filled 2021-05-06: qty 1

## 2021-05-06 MED ORDER — FOLIC ACID 1 MG PO TABS
1.0000 mg | ORAL_TABLET | Freq: Every day | ORAL | Status: DC
  Administered 2021-05-06 – 2021-05-08 (×3): 1 mg via ORAL
  Filled 2021-05-06 (×3): qty 1

## 2021-05-06 MED ORDER — POTASSIUM CHLORIDE 10 MEQ/50ML IV SOLN
10.0000 meq | INTRAVENOUS | Status: AC
  Administered 2021-05-06 (×4): 10 meq via INTRAVENOUS
  Filled 2021-05-06 (×4): qty 50

## 2021-05-06 MED ORDER — TRAZODONE HCL 100 MG PO TABS: 100.0000 mg | ORAL_TABLET | Freq: Every evening | ORAL | Status: DC | PRN

## 2021-05-06 MED ORDER — MAGNESIUM SULFATE 2 GM/50ML IV SOLN
2.0000 g | Freq: Once | INTRAVENOUS | Status: AC
  Administered 2021-05-06: 2 g via INTRAVENOUS
  Filled 2021-05-06: qty 50

## 2021-05-06 MED ORDER — FOLIC ACID 5 MG/ML IJ SOLN
1.0000 mg | Freq: Every day | INTRAMUSCULAR | Status: DC
Start: 2021-05-06 — End: 2021-05-06
  Filled 2021-05-06: qty 0.2

## 2021-05-06 MED ORDER — SERTRALINE HCL 100 MG PO TABS
100.0000 mg | ORAL_TABLET | Freq: Every day | ORAL | Status: DC
  Administered 2021-05-06 – 2021-05-08 (×3): 100 mg via ORAL
  Filled 2021-05-06 (×2): qty 2
  Filled 2021-05-06: qty 1

## 2021-05-06 MED ORDER — POTASSIUM CHLORIDE CRYS ER 20 MEQ PO TBCR
20.0000 meq | EXTENDED_RELEASE_TABLET | Freq: Once | ORAL | Status: AC
  Administered 2021-05-06: 20 meq via ORAL
  Filled 2021-05-06: qty 1

## 2021-05-06 MED ORDER — LEVETIRACETAM 500 MG PO TABS
1000.0000 mg | ORAL_TABLET | Freq: Two times a day (BID) | ORAL | Status: DC
  Administered 2021-05-06 – 2021-05-08 (×4): 1000 mg via ORAL
  Filled 2021-05-06 (×4): qty 2

## 2021-05-06 NOTE — Progress Notes (Signed)
?  Transition of Care (TOC) Screening Note ? ? ?Patient Details  ?Name: Peggy Vega ?Date of Birth: 04-28-1970 ? ? ?Transition of Care (TOC) CM/SW Contact:    ?Mearl Latin, LCSW ?Phone Number: ?05/06/2021, 10:02 AM ? ? ? ?Transition of Care Department [facility]) has reviewed patient. Patient not oriented to participate in substance use consult. We will continue to monitor patient advancement through interdisciplinary progression rounds. If new patient transition needs arise, please place a TOC consult. ? ? ?

## 2021-05-06 NOTE — Progress Notes (Signed)
?  NEUROSURGERY PROGRESS NOTE  ? ?No issues overnight. Pt c/o HA but appears much more comfortable today. Sitting in bedside chair. ? ?EXAM:  ?BP 109/73   Pulse 74   Temp (!) 97 ?F (36.1 ?C) (Axillary)   Resp 19   Ht 5\' 4"  (1.626 m)   Wt 76.9 kg   LMP  (LMP Unknown)   SpO2 100%   BMI 29.10 kg/m?  ? ?Awake, alert, oriented x person, hospital ?Speech fluent, appropriate  ?CN grossly intact  ?5/5 BUE/BLE  ? ?IMPRESSION:  ?51 y.o. female s/p fall and traumatic SZ. Improving neurologically ?- Hyponatremia resolved ?- ? EtOH w/I ? ?PLAN: ?- Cont to monitor neurologic exam. If stable, plan on transfer to stepdown tomorrow ?- Cont CIWA protocol ?- Cont Pt/OT/SLP ?- Likely need CIR upon d/c ? ? ?54, MD ?[facility]  ? ?

## 2021-05-06 NOTE — Evaluation (Signed)
Occupational Therapy Evaluation ?Patient Details ?Name: Peggy Vega ?MRN: [id number] ?DOB: 22-Apr-1970 ?Today's Date: 05/06/2021 ? ? ?History of Present Illness 51 y.o. female presents to [facility] hospital s/p fall on 05/04/2021, + ETOH. Pt with seizure-like activity after fall. CT head demonstrates right frontal intracerebral hemorrhage and occipital skull fracture. PMH includes ETOH abuse, depression.  ? ?Clinical Impression ?  ?Peggy Vega was evaluated s/p the above admission list, she is indep at baseline including driving and working from home. She lives in a 2 level home (bed/bath upstairs) with her husband and children. Upon evaluation pt was min A for bed mobility and simple transfers, however required significantly increased assist for any dynamic task such as ambulating to the bathroom and toileting needing mod A +2. Pt is limited by impaired cognition, impaired coordination, poor activity tolerance and limited insight to safety and deficits. She will benefit from OT acutely to address the deficits listed below. Recommend d/c to AIR to progress towards her indep baseline; pt lives in [place], [place] and family would prefer pt transfer to an AIR facility closer to them if possible.   ? ?Recommendations for follow up therapy are one component of a multi-disciplinary discharge planning process, led by the attending physician.  Recommendations may be updated based on patient status, additional functional criteria and insurance authorization.  ? ?Follow Up Recommendations ? Acute inpatient rehab (3hours/day)  ?  ?Assistance Recommended at Discharge Frequent or constant Supervision/Assistance  ?Patient can return home with the following A lot of help with walking and/or transfers;A lot of help with bathing/dressing/bathroom;Assistance with cooking/housework;Direct supervision/assist for medications management;Assist for transportation;Help with stairs or ramp for entrance ? ?  ?Functional Status Assessment ? Patient has  had a recent decline in their functional status and demonstrates the ability to make significant improvements in function in a reasonable and predictable amount of time.  ?Equipment Recommendations ? BSC/3in1 (RW)  ?  ?Recommendations for Other Services Rehab consult ? ? ?  ?Precautions / Restrictions Precautions ?Precautions: Fall ?Restrictions ?Weight Bearing Restrictions: No  ? ?  ? ?Mobility Bed Mobility ?Overal bed mobility: Needs Assistance ?Bed Mobility: Supine to Sit ?  ?  ?Supine to sit: Min assist ?  ?  ?  ?  ? ?Transfers ?Overall transfer level: Needs assistance ?Equipment used: 2 person hand held assist ?Transfers: Sit to/from Stand ?Sit to Stand: Min assist, +2 physical assistance, +2 safety/equipment ?  ?  ?  ?  ?  ?  ?  ? ?  ?Balance Overall balance assessment: Needs assistance ?Sitting-balance support: Feet supported ?Sitting balance-Leahy Scale: Fair ?  ?  ?Standing balance support: Bilateral upper extremity supported ?Standing balance-Leahy Scale: Poor ?  ?  ?  ?  ?  ?  ?  ?  ?  ?  ?  ?  ?   ? ?ADL either performed or assessed with clinical judgement  ? ?ADL Overall ADL's : Needs assistance/impaired ?Eating/Feeding: NPO;Sitting ?  ?Grooming: Moderate assistance;Cueing for safety;Cueing for sequencing;Standing ?Grooming Details (indicate cue type and reason): assist for problem solving, cues and standing balance ?Upper Body Bathing: Minimal assistance;Sitting ?  ?Lower Body Bathing: Moderate assistance;+2 for physical assistance;+2 for safety/equipment;Sit to/from stand ?Lower Body Bathing Details (indicate cue type and reason): balance and cues ?Upper Body Dressing : Minimal assistance;Sitting ?  ?Lower Body Dressing: Moderate assistance;+2 for physical assistance;+2 for safety/equipment;Sit to/from stand ?Lower Body Dressing Details (indicate cue type and reason): able to don socks sitting EOB ?Toilet Transfer: Moderate assistance;+2 for safety/equipment;+2  for physical assistance;Ambulation ?   ?Toileting- Clothing Manipulation and Hygiene: Moderate assistance;+2 for physical assistance;+2 for safety/equipment;Sit to/from stand ?Toileting - Clothing Manipulation Details (indicate cue type and reason): pt able to complete hygiene with lateral leans on the toilet, min A however required more assist for thoroughness after BM ?  ?  ?Functional mobility during ADLs: Moderate assistance;+2 for physical assistance;+2 for safety/equipment ?General ADL Comments: poor coordination both uppers and lowers. does better when statically standing than wtih dynamic movements. requires cues for safety, initiation, sequencing and problem solving  ? ? ? ?Vision Baseline Vision/History: 1 Wears glasses ?Ability to See in Adequate Light: 0 Adequate ?Vision Assessment?: Vision impaired- to be further tested in functional context ?Additional Comments: Pt was able to read normal print, stated "it's a little blurry." pt too fatigued/lethargic for full vision assessment - needs to be further assessed. possibly some inattention noted throughout  ?   ?Perception   ?  ?Praxis   ?  ? ?Pertinent Vitals/Pain Pain Assessment ?Pain Assessment: Faces ?Faces Pain Scale: No hurt ?Pain Intervention(s): Monitored during session  ? ? ? ?Hand Dominance Right ?  ?Extremity/Trunk Assessment Upper Extremity Assessment ?Upper Extremity Assessment: RUE deficits/detail;LUE deficits/detail (Needs to be further assessed) ?RUE Deficits / Details: Full ROM, weak, impiared motor coordination - Pt is R handed but initiated most functional tasks with L this date, possibly some inattention to R? ?RUE Sensation: WNL ?RUE Coordination: decreased fine motor;decreased gross motor ?LUE Deficits / Details: Full ROM, generally weak, poor motor coordination. ?LUE Sensation: WNL ?LUE Coordination: decreased fine motor;decreased gross motor ?  ?Lower Extremity Assessment ?Lower Extremity Assessment: Defer to PT evaluation ?  ?Cervical / Trunk Assessment ?Cervical /  Trunk Assessment: Normal ?  ?Communication Communication ?Communication: No difficulties (slow slurred speech) ?  ?Cognition Arousal/Alertness: Awake/alert ?Behavior During Therapy: Flat affect ?Overall Cognitive Status: Impaired/Different from baseline ?Area of Impairment: Memory, Orientation, Attention, Following commands, Safety/judgement, Problem solving, Awareness ?  ?  ?  ?  ?  ?  ?  ?  ?Orientation Level: Disoriented to, Place, Time ?Current Attention Level: Sustained ?Memory: Decreased short-term memory ?Following Commands: Follows one step commands with increased time ?Safety/Judgement: Decreased awareness of safety, Decreased awareness of deficits ?Awareness: Emergent ?Problem Solving: Slow processing, Decreased initiation, Difficulty sequencing ?General Comments: oriented to self and situation, repeating "[place]" after being oriented to [place]. Pt not from this area. Pt answered mose questions appropriately with incr time for processing, followed most simple one step commands; had more difficulty with multi-step commands. Poor insight. ?  ?  ?General Comments  VSS on RA ? ?  ?Exercises   ?  ?Shoulder Instructions    ? ? ?Home Living Family/patient expects to be discharged to:: Private residence ?Living Arrangements: Spouse/significant other;Children ?Available Help at Discharge: Family;Available 24 hours/day ?Type of Home: House ?Home Access: Stairs to enter ?Entrance Stairs-Number of Steps: 2 ?Entrance Stairs-Rails: None ?Home Layout: Two level;Bed/bath upstairs ?Alternate Level Stairs-Number of Steps: flight ?  ?Bathroom Shower/Tub: Walk-in shower ?  ?Bathroom Toilet: Standard ?  ?  ?Home Equipment: None ?  ?Additional Comments: no downstairs bedrooms; lives with husband and 4 children, husband works ? Lives With: Spouse ? ?  ?Prior Functioning/Environment Prior Level of Function : Independent/Modified Independent;Driving;Working/employed ?  ?  ?  ?  ?  ?  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  ? ?  ?  ?OT Problem List: Decreased strength;Decreased range of motion;Decreased activity tolerance;Impaired balance (sitting and/or  standing);Decreased coordination;Decreased s

## 2021-05-06 NOTE — TOC Initial Note (Signed)
Transition of Care (TOC) - Initial/Assessment Note  ? ? ?Patient Details  ?Name: Peggy Vega ?MRN: [id number] ?Date of Birth: [birth year] ? ?Transition of Care (TOC) CM/SW Contact:    ?Mearl Latin, LCSW ?Phone Number: ?05/06/2021, 1:10 PM ? ?Clinical Narrative:                 ?Per OT, spouse interested in IR closer to home. CSW spoke with spouse and explained barriers in getting her to the [employer] IR, including the cost of transport. He reported understanding and that he is agreeable to [facility]. Will continue to follow.  ? ?Expected Discharge Plan: IP Rehab Facility ?Barriers to Discharge: English as a second language teacher, Continued Medical Work up ? ? ?Patient Goals and CMS Choice ?Patient states their goals for this hospitalization and ongoing recovery are:: Rehab ?CMS Medicare.gov Compare Post Acute Care list provided to:: Patient Represenative (must comment) ?Choice offered to / list presented to : Spouse ? ?Expected Discharge Plan and Services ?Expected Discharge Plan: IP Rehab Facility ?  ?  ?Post Acute Care Choice: IP Rehab ?Living arrangements for the past 2 months: Single Family Home ?                ?  ?  ?  ?  ?  ?  ?  ?  ?  ?  ? ?Prior Living Arrangements/Services ?Living arrangements for the past 2 months: Single Family Home ?Lives with:: Spouse, Minor Children ?Patient language and need for interpreter reviewed:: Yes ?Do you feel safe going back to the place where you live?: Yes      ?Need for Family Participation in Patient Care: Yes (Comment) ?Care giver support system in place?: Yes (comment) ?  ?Criminal Activity/Legal Involvement Pertinent to Current Situation/Hospitalization: No - Comment as needed ? ?Activities of Daily Living ?  ?  ? ?Permission Sought/Granted ?Permission sought to share information with : Facility Medical sales representative, Family Supports ?Permission granted to share information with : No ? Share Information with NAME: Jeannett Senior ? Permission granted to share info w  AGENCY: IR ? Permission granted to share info w Relationship: Spouse ? Permission granted to share info w Contact Information: [phone] ? ?Emotional Assessment ?Appearance:: Appears stated age ?Attitude/Demeanor/Rapport: Unable to Assess ?Affect (typically observed): Unable to Assess ?Orientation: : Oriented to Self ?Alcohol / Substance Use: Alcohol Use ?Psych Involvement: No (comment) ? ?Admission diagnosis:  ICH (intracerebral hemorrhage) (HCC) [I61.9] ?Patient Active Problem List  ? Diagnosis Date Noted  ? Malnutrition of moderate degree 05/06/2021  ? ICH (intracerebral hemorrhage) (HCC) 05/04/2021  ? ?PCP:  Pcp, No ?Pharmacy:   ?[id number] Cornelious Bryant, [place] - [address] AT [facility] [address] ?[address] ?[place] [address] ?Phone: [phone] Fax: [phone] ? ? ? ? ?Social Determinants of Health (SDOH) Interventions ?  ? ?Readmission Risk Interventions ?   ? View : No data to display.  ?  ?  ?  ? ? ? ?

## 2021-05-06 NOTE — Progress Notes (Signed)
Speech Language Pathology Treatment: Dysphagia  ?Patient Details ?Name: Peggy Vega ?MRN: [id number] ?DOB: Feb 03, 1971 ?Today's Date: 05/06/2021 ?Time: [phone] ?SLP Time Calculation (min) (ACUTE ONLY): 14 min ? ?Assessment / Plan / Recommendation ?Clinical Impression ? Pt fully alert, requesting PO and removal of cortrak. Pt able to consume thin liquids, puree and solids without difficulty. Pt may resume a regular diet with thin liquids. No SLP f/u needed for swallowing, will sign off.   ?HPI HPI: Pt is a 51 y.o. female who was brought to the ET by her husband after suffering a fall. Per H&P, pt was somewhat intoxicated, fell backwards while walking, hit the back of her head, lost consciousness, and had a witnessed seizure. Imaging revealed right frontal intraparenchymal hematoma, non-displaced right occipital skull fracture with large right posterior scalp hematoma, no acute cervical spine fracture on CT of the head/neck.  EEG 3/26: moderate diffuse encephalopathy, nonspecific etiology. No seizures or epileptiform discharges. ?  ?   ?SLP Plan ? All goals met ? ?  ?  ?Recommendations for follow up therapy are one component of a multi-disciplinary discharge planning process, led by the attending physician.  Recommendations may be updated based on patient status, additional functional criteria and insurance authorization. ?  ? ?Recommendations  ?Diet recommendations: Regular;Thin liquid ?Liquids provided via: Cup;Straw  ?   ?    ?   ? ? ? ? Oral Care Recommendations: Oral care BID ?Follow Up Recommendations: No SLP follow up ?Plan: All goals met ? ? ? ? ?  ?  ? ? ?Cloteal Isaacson, Katherene Ponto ? ?05/06/2021, 9:43 AM ?

## 2021-05-06 NOTE — Evaluation (Signed)
Physical Therapy Evaluation ?Patient Details ?Name: Peggy DialsJennifer Liana Grow ?MRN: [id number] ?DOB: 1970-08-15 ?Today's Date: 05/06/2021 ? ?History of Present Illness ? 51 y.o. female [facility] hospital s/p fall on 05/04/2021, + ETOH. Pt with seizure-like activity after fall. CT head demonstrates right frontal intracerebral hemorrhage and occipital skull fracture. PMH includes ETOH abuse, depression.  ?Clinical Impression ? Pt admitted with above diagnosis. [place] [employer] [place]. She keeps saying she is in [place] even when redirected. Pt very slow to process info and with little to no insight into deficits. Pt able to come to EOB with min A, however, once up, pt needed max A +2 due to severe ataxia with gait. Pt will need intense rehab before going home. Husband would prefer to find an AIR unit closer to their home if possible.  Pt currently with functional limitations due to the deficits listed below (see PT Problem List). Pt will benefit from skilled PT to increase their independence and safety with mobility to allow discharge to the venue listed below.   ?   ?   ? ?Recommendations for follow up therapy are one component of a multi-disciplinary discharge planning process, led by the attending physician.  Recommendations may be updated based on patient status, additional functional criteria and insurance authorization. ? ?Follow Up Recommendations Acute inpatient rehab (3hours/day) ? ?  ?Assistance Recommended at Discharge Frequent or constant Supervision/Assistance  ?Patient can return home with the following ? A lot of help with walking and/or transfers;A lot of help with bathing/dressing/bathroom;Assistance with cooking/housework;Direct supervision/assist for medications management;Direct supervision/assist for financial management;Assist for transportation;Help with stairs or ramp for entrance ? ?  ?Equipment Recommendations Other (comment) (TBD)  ?Recommendations for Other Services ?  Rehab consult  ?  ?Functional Status Assessment Patient has had a recent decline in their functional status and demonstrates the ability to make significant improvements in function in a reasonable and predictable amount of time.  ? ?  ?Precautions / Restrictions Precautions ?Precautions: Fall ?Restrictions ?Weight Bearing Restrictions: No  ? ?  ? ?Mobility ? Bed Mobility ?Overal bed mobility: Needs Assistance ?Bed Mobility: Supine to Sit ?  ?  ?Supine to sit: Min assist ?  ?  ?General bed mobility comments: pt able to come up into long sitting from elevated HOB. Min A for safety as pt pivoted to edge as she is with mvmt patterns that are not safe and she has little to no awareness of her deficits ?  ? ?Transfers ?Overall transfer level: Needs assistance ?Equipment used: 2 person hand held assist ?Transfers: Sit to/from Stand ?Sit to Stand: Min assist, +2 physical assistance, +2 safety/equipment ?  ?  ?  ?  ?  ?General transfer comment: to steady ?  ? ?Ambulation/Gait ?Ambulation/Gait assistance: Max assist, +2 physical assistance ?Gait Distance (Feet): 10 Feet (2x) ?Assistive device: 2 person hand held assist ?Gait Pattern/deviations: Ataxic, Narrow base of support ?Gait velocity: decreased ?Gait velocity interpretation: <1.31 ft/sec, indicative of household ambulator ?  ?General Gait Details: pt severely ataxic ? ?Stairs ?  ?  ?  ?  ?  ? ?Wheelchair Mobility ?  ? ?Modified Rankin (Stroke Patients Only) ?  ? ?  ? ?Balance Overall balance assessment: Needs assistance ?Sitting-balance support: Feet supported ?Sitting balance-Leahy Scale: Fair ?  ?  ?Standing balance support: Bilateral upper extremity supported ?Standing balance-Leahy Scale: Poor ?  ?  ?  ?  ?  ?  ?  ?  ?  ?  ?  ?  ?   ? ? ? ?  Pertinent Vitals/Pain Pain Assessment ?Pain Assessment: Faces ?Faces Pain Scale: No hurt  ? ? ?Home Living Family/patient expects to be discharged to:: Private residence ?Living Arrangements: Spouse/significant  other;Children ?Available Help at Discharge: Family;Available 24 hours/day ?Type of Home: House ?Home Access: Stairs to enter ?Entrance Stairs-Rails: None ?Entrance Stairs-Number of Steps: 2 ?Alternate Level Stairs-Number of Steps: flight ?Home Layout: Two level;Bed/bath upstairs ?Home Equipment: None ?Additional Comments: no downstairs bedrooms; lives with husband and 2 of her [name], husband works  ?  ?Prior Function Prior Level of Function : Independent/Modified Independent;Driving;Working/employed ?  ?  ?  ?  ?  ?  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  ? ? ?Hand Dominance  ? Dominant Hand: Right ? ?  ?Extremity/Trunk Assessment  ? Upper Extremity Assessment ?Upper Extremity Assessment: Defer to OT evaluation ?  ? ?Lower Extremity Assessment ?Lower Extremity Assessment: RLE deficits/detail;LLE deficits/detail ?RLE Deficits / Details: LE ataxia noted with gait. Strength WFL ?RLE Sensation: decreased proprioception ?RLE Coordination: decreased gross motor ?LLE Deficits / Details: LE ataxia with gait ?LLE Sensation: decreased proprioception ?LLE Coordination: decreased gross motor ?  ? ?Cervical / Trunk Assessment ?Cervical / Trunk Assessment: Normal  ?Communication  ? Communication: Expressive difficulties (slow slurred speech)  ?Cognition Arousal/Alertness: Awake/alert ?Behavior During Therapy: Flat affect ?Overall Cognitive Status: Impaired/Different from baseline ?Area of Impairment: Memory, Orientation, Attention, Following commands, Safety/judgement, Problem solving, Awareness ?  ?  ?  ?  ?  ?  ?  ?  ?Orientation Level: Disoriented to, Place, Time ?Current Attention Level: Sustained ?Memory: Decreased short-term memory ?Following Commands: Follows one step commands with increased time ?Safety/Judgement: Decreased awareness of safety, Decreased awareness of deficits ?Awareness: Emergent ?Problem Solving: Slow processing, Decreased initiation, Difficulty sequencing ?General  Comments: oriented to self and situation, repeating "[place]" after being oriented to [place]. (Pt is from [place], only here for [employer] concert). Pt answered mose questions appropriately with incr time for processing, followed most simple one step commands; had more difficulty with multi-step commands. Poor insight. ?  ?  ? ?  ?General Comments General comments (skin integrity, edema, etc.): VSS on RA. Husband present end of session. Pt flat at times, emotionally labile at other times ? ?  ?Exercises    ? ?Assessment/Plan  ?  ?PT Assessment Patient needs continued PT services  ?PT Problem List Decreased activity tolerance;Decreased balance;Decreased mobility;Decreased coordination;Decreased cognition;Decreased knowledge of precautions;Decreased safety awareness ? ?   ?  ?PT Treatment Interventions Gait training;Stair training;Functional mobility training;Therapeutic activities;DME instruction;Therapeutic exercise;Balance training;Neuromuscular re-education;Cognitive remediation;Patient/family education   ? ?PT Goals (Current goals can be found in the Care Plan section)  ?Acute Rehab PT Goals ?Patient Stated Goal: return to PLOF ?PT Goal Formulation: With patient/family ?Time For Goal Achievement: 05/20/21 ?Potential to Achieve Goals: Good ? ?  ?Frequency Min 4X/week ?  ? ? ?Co-evaluation PT/OT/SLP Co-Evaluation/Treatment: Yes ?Reason for Co-Treatment: Complexity of the patient's impairments (multi-system involvement);Necessary to address cognition/behavior during functional activity ?PT goals addressed during session: Mobility/safety with mobility;Balance ?  ?  ? ? ?  ?AM-PAC PT "6 Clicks" Mobility  ?Outcome Measure Help needed turning from your back to your side while in a flat bed without using bedrails?: None ?Help needed moving from lying on your back to sitting on the side of a flat bed without using bedrails?: A Little ?Help needed moving to and from a bed to a chair (including a wheelchair)?: A  Lot ?Help needed standing up from a chair using your arms (e.g.,  wheelchair or bedside chair)?: A Lot ?Help needed to walk in hospital room?: A Lot ?Help needed climbing 3-5 steps with a railing? : Total ?6 Click Score: 14 ? ?  ?

## 2021-05-06 NOTE — Progress Notes (Signed)
Inpatient Rehab Admissions Coordinator Note:  ? ?Per OT recommendations patient was screened for CIR candidacy by Stephania Fragmin, PT. At this time, pt appears to be a potential candidate for CIR. I will place an order for rehab consult for full assessment, per our protocol.  Please contact me any with questions.. ? ?Estill Dooms, PT, DPT ?[phone] ?05/06/21 ?12:50 PM ?  ?
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
## 2021-05-06 NOTE — Progress Notes (Signed)
K+ 3.3, Mg 1.9 °Replaced per protocol  °

## 2021-05-06 NOTE — Evaluation (Signed)
Speech Language Pathology Evaluation ?Patient Details ?Name: Peggy Vega ?MRN: [id number] ?DOB: 05/18/1970 ?Today's Date: 05/06/2021 ?Time: [phone] ?SLP Time Calculation (min) (ACUTE ONLY): 14 min ? ?Problem List:  ?Patient Active Problem List  ? Diagnosis Date Noted  ? Malnutrition of moderate degree 05/06/2021  ? ICH (intracerebral hemorrhage) (HCC) 05/04/2021  ? ?Past Medical History:  ?Past Medical History:  ?Diagnosis Date  ? Depression   ? ETOH abuse   ? Hyponatremia   ? Seizure (HCC)   ? ?Past Surgical History: History reviewed. No pertinent surgical history. ?HPI:  ?Pt is a 51 y.o. female who was brought to the ET by her husband after suffering a fall. Per H&P, pt was somewhat intoxicated, fell backwards while walking, hit the back of her head, lost consciousness, and had a witnessed seizure. Imaging revealed right frontal intraparenchymal hematoma, non-displaced right occipital skull fracture with large right posterior scalp hematoma, no acute cervical spine fracture on CT of the head/neck.  EEG 3/26: moderate diffuse encephalopathy, nonspecific etiology. No seizures or epileptiform discharges.  ? ?Assessment / Plan / Recommendation ?Clinical Impression ? Pt demonstrates cognitive impairment following TBI and seizure activity with sedating medications. Behavior most consistent with a [place] (Confused/appropriate).  Pt with slow speech with abnormal prosody. She is attentive to verbal and functional tasks with verbal cues to focus, but can become distracted in a busy environment. She is slowly becoming oriented to place time and situation, with some short term memory impairment that may impact her ability to be consistent with this. SLP offered her carrier phrases and fill in the blank sentences to aid her in verbalizing orientation to situation and the cause of her hospitalization. Pt is impulsive with poor safety awareness; her reasoning ability for safety is currently absent. Pt may benefit from  a short stay in inpatient rehabilitation prior to d/c home with full supervision. ?   ?SLP Assessment ? SLP Recommendation/Assessment: Patient needs continued Speech Lanaguage Pathology Services ?SLP Visit Diagnosis: Cognitive communication deficit (R41.841)  ?  ?Recommendations for follow up therapy are one component of a multi-disciplinary discharge planning process, led by the attending physician.  Recommendations may be updated based on patient status, additional functional criteria and insurance authorization. ?   ?Follow Up Recommendations ? Acute inpatient rehab (3hours/day)  ?  ?Assistance Recommended at Discharge ? Frequent or constant Supervision/Assistance  ?Functional Status Assessment Patient has had a recent decline in their functional status and demonstrates the ability to make significant improvements in function in a reasonable and predictable amount of time.  ?Frequency and Duration min 2x/week  ?2 weeks ?  ?   ?SLP Evaluation ?Cognition ? Overall Cognitive Status: Impaired/Different from baseline ?Arousal/Alertness: Awake/alert ?Orientation Level: Oriented to person;Disoriented to place;Disoriented to time;Oriented to situation ?Attention: Selective;Sustained ?Sustained Attention: Appears intact ?Selective Attention: Impaired ?Selective Attention Impairment: Verbal complex ?Memory: Impaired ?Memory Impairment: Storage deficit;Decreased short term memory ?Decreased Short Term Memory: Verbal basic ?Awareness: Impaired ?Awareness Impairment: Intellectual impairment;Emergent impairment;Anticipatory impairment ?Problem Solving: Impaired ?Executive Function: Reasoning;Self Monitoring;Self Correcting ?Reasoning: Impaired ?Reasoning Impairment: Verbal complex;Functional complex ?Self Monitoring: Impaired ?Self Monitoring Impairment: Verbal complex;Functional complex ?Self Correcting: Impaired ?Behaviors: Impulsive ?Safety/Judgment: Impaired  ?  ?   ?Comprehension ? Auditory Comprehension ?Overall Auditory  Comprehension: Appears within functional limits for tasks assessed  ?  ?Expression Verbal Expression ?Overall Verbal Expression: Appears within functional limits for tasks assessed ?Written Expression ?Dominant Hand: Right   ?Oral / Motor ? Motor Speech ?Overall Motor Speech: Appears within functional limits for tasks  assessed   ?        ? ?Carrieanne Kleen, Riley Nearing ?05/06/2021, 11:11 AM ? ?

## 2021-05-06 NOTE — Plan of Care (Signed)
?  Problem: Activity: ?Goal: Risk for activity intolerance will decrease ?Outcome: Progressing ?  ?Problem: Nutrition: ?Goal: Adequate nutrition will be maintained ?Outcome: Progressing ?  ?Problem: Elimination: ?Goal: Will not experience complications related to urinary retention ?Outcome: Progressing ?  ?Problem: Skin Integrity: ?Goal: Risk for impaired skin integrity will decrease ?Outcome: Progressing ?  ?Problem: Safety: ?Goal: Non-violent Restraint(s) ?Outcome: Completed/Met ?  ?

## 2021-05-07 MED ORDER — ENSURE ENLIVE PO LIQD
237.0000 mL | Freq: Two times a day (BID) | ORAL | Status: DC
  Administered 2021-05-08: 237 mL via ORAL

## 2021-05-07 MED ORDER — DOCUSATE SODIUM 100 MG PO CAPS
100.0000 mg | ORAL_CAPSULE | Freq: Every day | ORAL | Status: DC
  Filled 2021-05-07: qty 1

## 2021-05-07 MED ORDER — POLYETHYLENE GLYCOL 3350 17 G PO PACK
17.0000 g | PACK | Freq: Every day | ORAL | Status: DC
  Filled 2021-05-07: qty 1

## 2021-05-07 NOTE — Progress Notes (Signed)
Occupational Therapy Treatment ?Patient Details ?Name: Peggy Vega ?MRN: [id number] ?DOB: 1970-07-09 ?Today's Date: 05/07/2021 ? ? ?History of present illness 51 y.o. female presents to [facility] s/p fall on 05/04/2021, + ETOH. Pt with seizure-like activity after fall. CT head demonstrates right frontal intracerebral hemorrhage and occipital skull fracture. PMH includes ETOH abuse, depression. ?  ?OT comments ? Patient with fair progress toward patient focused goals.  Patient continues to have slowed mentation, decreased safety awareness and insight into her deficits.  She is moving better compared to the evaluation session, but remains very unsteady.  Patient is needing less assist overall, but continues to need up to Mod A for lower body ADL from a sit/stand level, and Min A for basic in room mobility/toileting.  OT will continue efforts in the acute setting to maximize her functional status, with AIR continuing to be recommended prior to returning home.  Patient will benefit from aggressive cognitive and physical rehab.     ? ?Recommendations for follow up therapy are one component of a multi-disciplinary discharge planning process, led by the attending physician.  Recommendations may be updated based on patient status, additional functional criteria and insurance authorization. ?   ?Follow Up Recommendations ? Acute inpatient rehab (3hours/day)  ?  ?Assistance Recommended at Discharge Frequent or constant Supervision/Assistance  ?Patient can return home with the following ? A lot of help with walking and/or transfers;A lot of help with bathing/dressing/bathroom;Assistance with cooking/housework;Direct supervision/assist for medications management;Assist for transportation;Help with stairs or ramp for entrance ?  ?Equipment Recommendations ? BSC/3in1  ?  ?Recommendations for Other Services   ? ?  ?Precautions / Restrictions Precautions ?Precautions: Fall ?Restrictions ?Weight Bearing Restrictions: No  ? ? ?   ? ?Mobility Bed Mobility ?  ?Bed Mobility: Supine to Sit ?  ?  ?Supine to sit: Min guard ?  ?  ?  ?  ? ?Transfers ?Overall transfer level: Needs assistance ?Equipment used: 1 person hand held assist ?Transfers: Sit to/from Stand, Bed to chair/wheelchair/BSC ?Sit to Stand: Min guard ?  ?  ?Step pivot transfers: Min assist ?  ?  ?  ?  ?  ?Balance Overall balance assessment: Needs assistance ?Sitting-balance support: Feet supported ?Sitting balance-Leahy Scale: Good ?  ?  ?Standing balance support: Single extremity supported ?Standing balance-Leahy Scale: Poor ?  ?  ?  ?  ?  ?  ?  ?  ?  ?  ?  ?  ?   ? ?ADL either performed or assessed with clinical judgement  ? ?ADL   ?  ?  ?Grooming: Standing;Minimal assistance ?  ?  ?  ?  ?  ?  ?  ?Lower Body Dressing: Moderate assistance;Sit to/from stand ?  ?Toilet Transfer: Moderate assistance;Regular Toilet;Ambulation ?  ?  ?  ?  ?  ?  ?  ?  ? ?Extremity/Trunk Assessment Upper Extremity Assessment ?Upper Extremity Assessment: Overall WFL for tasks assessed ?RUE Deficits / Details: slow and purposeful ?  ?Lower Extremity Assessment ?Lower Extremity Assessment: Defer to PT evaluation ?  ?  ?  ? ?Vision   ?  ?  ?Perception   ?  ?Praxis   ?  ? ?Cognition Arousal/Alertness: Awake/alert ?Behavior During Therapy: Flat affect ?Overall Cognitive Status: Impaired/Different from baseline ?  ?  ?  ?  ?  ?  ?  ?  ?  ?  ?Current Attention Level: Sustained ?Memory: Decreased short-term memory ?Following Commands: Follows one step commands with increased time ?Safety/Judgement: Decreased  awareness of safety, Decreased awareness of deficits ?Awareness: Emergent ?Problem Solving: Slow processing ?General Comments: Better orientation to place. ?  ?  ?   ?Exercises   ? ?  ?Shoulder Instructions   ? ? ?  ?General Comments    ? ? ?Pertinent Vitals/ Pain       Pain Assessment ?Pain Assessment: Faces ?Faces Pain Scale: Hurts a little bit ?Pain Location: head ?Pain Descriptors / Indicators: Dull ?Pain  Intervention(s): Monitored during session ? ?   ?  ?  ?  ?  ?  ?  ?  ?  ?  ?  ?  ?  ?  ?  ?  ?  ?  ?  ? ?  ?    ?  ?  ?  ?   ? ?Frequency ? Min 2X/week  ? ? ? ? ?  ?Progress Toward Goals ? ?OT Goals(current goals can now be found in the care plan section) ? Progress towards OT goals: Progressing toward goals ? ?Acute Rehab OT Goals ?OT Goal Formulation: With patient ?Time For Goal Achievement: 05/06/21 ?Potential to Achieve Goals: Good  ?Plan Discharge plan remains appropriate   ? ?Co-evaluation ? ? ?   ?  ?  ?  ?  ? ?  ?AM-PAC OT "6 Clicks" Daily Activity     ?Outcome Measure ? ? Help from another person eating meals?: A Little ?Help from another person taking care of personal grooming?: A Little ?Help from another person toileting, which includes using toliet, bedpan, or urinal?: A Little ?Help from another person bathing (including washing, rinsing, drying)?: A Lot ?Help from another person to put on and taking off regular upper body clothing?: A Little ?Help from another person to put on and taking off regular lower body clothing?: A Lot ?6 Click Score: 16 ? ?  ?End of Session Equipment Utilized During Treatment: Gait belt ? ?OT Visit Diagnosis: Unsteadiness on feet (R26.81);Other abnormalities of gait and mobility (R26.89);Muscle weakness (generalized) (M62.81);History of falling (Z91.81) ?  ?Activity Tolerance Patient tolerated treatment well ?  ?Patient Left in chair;with call bell/phone within reach;with chair alarm set ?  ?Nurse Communication Mobility status ?  ? ?   ? ?Time: 3016-0109 ?OT Time Calculation (min): 20 min ? ?Charges: OT General Charges ?$OT Visit: 1 Visit ?OT Treatments ?$Self Care/Home Management : 8-22 mins ? ?05/07/2021 ? ?RP, OTR/L ? ?Acute Rehabilitation Services ? ?Office:  [phone] ? ? ?Peggy Vega ?05/07/2021, 1:49 PM ?

## 2021-05-07 NOTE — Progress Notes (Signed)
Nutrition Follow-up ? ?DOCUMENTATION CODES:  ? ?Non-severe (moderate) malnutrition in context of social or environmental circumstances ? ?INTERVENTION:  ? ?Ensure Enlive po BID, each supplement provides 350 kcal and 20 grams of protein. ? ?Encourage PO intake  ? ? ?NUTRITION DIAGNOSIS:  ? ?Moderate Malnutrition related to social / environmental circumstances (EtOH abuse) as evidenced by mild fat depletion, moderate muscle depletion. ?Ongoing.  ? ?GOAL:  ? ?Patient will meet greater than or equal to 90% of their needs ?Progressing.  ? ?MONITOR:  ? ?PO intake, Supplement acceptance ? ?REASON FOR ASSESSMENT:  ? ?Consult ?Enteral/tube feeding initiation and management ? ?ASSESSMENT:  ? ?51 year old female who presented to the ED on 3/26 after syncopal episode and seizure-like activity after a fall. Pt had consumed 8-9 beers. PMH of EtOH abuse, depression. Pt found to have traumatic R frontal hematoma and R occipital fracture. ? ?Pt discussed during ICU rounds and with RN. Pt more alert, cortrak removed.  ?Plan for rehab at d/c.  ? ?3/27 cortrak placed ?3/28 cortrak removed ?  ?Meds and labs reviewed ?ETOH: 130 ? ?Diet Order:   ?Diet Order   ? ?       ?  Diet regular Room service appropriate? Yes with Assist; Fluid consistency: Thin  Diet effective now       ?  ? ?  ?  ? ?  ? ? ?EDUCATION NEEDS:  ? ?Not appropriate for education at this time ? ?Skin:  Skin Assessment: Reviewed RN Assessment ? ?Last BM:  3/26 ? ?Height:  ? ?Ht Readings from Last 1 Encounters:  ?05/04/21 5\' 4"  (1.626 m)  ? ? ?Weight:  ? ?Wt Readings from Last 1 Encounters:  ?05/04/21 76.9 kg  ? ?BMI:  Body mass index is 29.1 kg/m?. ? ?Estimated Nutritional Needs:  ? ?Kcal:  1800-2000 ? ?Protein:  85-100 grams ? ?Fluid:  1.8-2.0 L ? ?05/06/21., RD, LDN, CNSC ?See AMiON for contact information  ? ?

## 2021-05-07 NOTE — Progress Notes (Signed)
?  NEUROSURGERY PROGRESS NOTE  ? ?No issues overnight. Pt has HA this am. ? ?EXAM:  ?BP 129/77 (BP Location: Left Arm)   Pulse 73   Temp 98.3 ?F (36.8 ?C) (Oral)   Resp 16   Ht 5\' 4"  (1.626 m)   Wt 76.9 kg   LMP  (LMP Unknown)   SpO2 98%   BMI 29.10 kg/m?  ? ?Awake, alert, oriented  ?Speech fluent, appropriate  ?CN grossly intact  ?5/5 BUE/BLE  ? ?IMPRESSION:  ?51 y.o. female s/p fall with mild-mod TBI and early post-traumatic SZ ?- Hyponatremia corrected ? ?PLAN: ?- Cont PT/OT ?- Stable for transfer to stepdown or CIR for dispo. ? ? ?54, MD ?[facility]  ? ?
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
## 2021-05-07 NOTE — Progress Notes (Addendum)
Inpatient Rehabilitation Admissions Coordinator  ? ?I met with patient at bedside for rehab assessment.t I discussed the nedd for rehab, goals and expectations. She is anxious to get rehab and return home. I will begin Auth with [employer] for a possible admit. I spoke with her spouse by phone and reviewed CIR goals and expectations also. ? ?Danne Baxter, RN, MSN ?Rehab Admissions Coordinator ?[phone] ?05/07/2021 11:50 AM ? ?

## 2021-05-07 NOTE — Progress Notes (Signed)
Physical Therapy Treatment ?Patient Details ?Name: Peggy Vega ?MRN: [id number] ?DOB: 05-31-1970 ?Today's Date: 05/07/2021 ? ? ?History of Present Illness 51 y.o. female presents to [facility] hospital s/p fall on 05/04/2021, + ETOH. Pt with seizure-like activity after fall. CT head demonstrates right frontal intracerebral hemorrhage and occipital skull fracture. PMH includes ETOH abuse, depression. ? ?  ?PT Comments  ? ? Pt tolerates treatment well but remains flat and shows reduced insight into deficits. Pt with poor memory during session, reports having PT earlier in the day, likely confusing for OT, but is unable to provide any descriptors of therapist. Pt ambulates with rigid and non-fluid gait, often with brief pauses with increased time in bilateral stance phase. Pt will benefit from continued aggressive mobilization in an effort to restore independence. PT continues to recommend AIR placement.   ?Recommendations for follow up therapy are one component of a multi-disciplinary discharge planning process, led by the attending physician.  Recommendations may be updated based on patient status, additional functional criteria and insurance authorization. ? ?Follow Up Recommendations ? Acute inpatient rehab (3hours/day) ?  ?  ?Assistance Recommended at Discharge Frequent or constant Supervision/Assistance  ?Patient can return home with the following A little help with walking and/or transfers;A little help with bathing/dressing/bathroom;Assistance with cooking/housework;Direct supervision/assist for medications management;Direct supervision/assist for financial management;Assist for transportation;Help with stairs or ramp for entrance ?  ?Equipment Recommendations ? Other (comment) (TBD)  ?  ?Recommendations for Other Services   ? ? ?  ?Precautions / Restrictions Precautions ?Precautions: Fall ?Restrictions ?Weight Bearing Restrictions: No  ?  ? ?Mobility ? Bed Mobility ?Overal bed mobility: Needs Assistance ?Bed  Mobility: Sit to Supine ?  ?  ?  ?Sit to supine: Supervision ?  ?  ?  ? ?Transfers ?Overall transfer level: Needs assistance ?Equipment used: None ?Transfers: Sit to/from Stand ?Sit to Stand: Min guard ?  ?  ?  ?  ?  ?  ?  ? ?Ambulation/Gait ?Ambulation/Gait assistance: Min assist ?Gait Distance (Feet): 120 Feet ?Assistive device: None ?Gait Pattern/deviations: Step-through pattern ?Gait velocity: reduced ?Gait velocity interpretation: <1.8 ft/sec, indicate of risk for recurrent falls ?  ?General Gait Details: pt with slowed step-through gait, non-fluid gait pattern with pauses between steps of each leg. PT attempts to encourage increased gait speed with continuous step-through gait which pt is able to perform for brief periods but she quickly returns to slowed step-through gait with stoppages of gait in between steps ? ? ?Stairs ?  ?  ?  ?  ?  ? ? ?Wheelchair Mobility ?  ? ?Modified Rankin (Stroke Patients Only) ?  ? ? ?  ?Balance Overall balance assessment: Needs assistance ?Sitting-balance support: No upper extremity supported, Feet supported ?Sitting balance-Leahy Scale: Good ?  ?  ?Standing balance support: No upper extremity supported, During functional activity ?Standing balance-Leahy Scale: Poor ?Standing balance comment: minG-minA without UE support ?  ?  ?  ?  ?  ?  ?  ?  ?  ?  ?  ?  ? ?  ?Cognition Arousal/Alertness: Awake/alert ?Behavior During Therapy: Flat affect ?Overall Cognitive Status: Impaired/Different from baseline ?Area of Impairment: Memory, Safety/judgement, Awareness, Problem solving ?  ?  ?  ?  ?  ?  ?  ?  ?  ?  ?Memory: Decreased short-term memory ?Following Commands: Follows one step commands with increased time ?Safety/Judgement: Decreased awareness of safety, Decreased awareness of deficits ?Awareness: Emergent ?Problem Solving: Slow processing ?  ?  ?  ? ?  ?  Exercises   ? ?  ?General Comments General comments (skin integrity, edema, etc.): VSS on RA ?  ?  ? ?Pertinent Vitals/Pain Pain  Assessment ?Pain Assessment: No/denies pain  ? ? ?Home Living   ?Living Arrangements: Spouse/significant other;Children (19 year old twins) ?  ?  ?  ?  ?  ?  ?  ?  ?   ?  ?Prior Function    ?  ?  ?   ? ?PT Goals (current goals can now be found in the care plan section) Acute Rehab PT Goals ?Patient Stated Goal: return to PLOF ?Progress towards PT goals: Progressing toward goals ? ?  ?Frequency ? ? ? Min 4X/week ? ? ? ?  ?PT Plan Current plan remains appropriate  ? ? ?Co-evaluation   ?  ?  ?  ?  ? ?  ?AM-PAC PT "6 Clicks" Mobility   ?Outcome Measure ? Help needed turning from your back to your side while in a flat bed without using bedrails?: None ?Help needed moving from lying on your back to sitting on the side of a flat bed without using bedrails?: A Little ?Help needed moving to and from a bed to a chair (including a wheelchair)?: A Little ?Help needed standing up from a chair using your arms (e.g., wheelchair or bedside chair)?: A Little ?Help needed to walk in hospital room?: A Little ?Help needed climbing 3-5 steps with a railing? : Total ?6 Click Score: 17 ? ?  ?End of Session   ?Activity Tolerance: Patient tolerated treatment well ?Patient left: in bed;with call bell/phone within reach;with bed alarm set ?Nurse Communication: Mobility status ?PT Visit Diagnosis: Unsteadiness on feet (R26.81);Ataxic gait (R26.0);History of falling (Z91.81) ?  ? ? ?Time: 1660-6301 ?PT Time Calculation (min) (ACUTE ONLY): 17 min ? ?Charges:  $Gait Training: 8-22 mins          ?          ? ?Arlyss Gandy, PT, DPT ?Acute Rehabilitation ?Pager: [phone] ?Office [phone] ? ? ? ?Arlyss Gandy ?05/07/2021, 3:39 PM ? ?

## 2021-05-08 ENCOUNTER — Encounter (HOSPITAL_COMMUNITY): Payer: Self-pay | Admitting: Physical Medicine & Rehabilitation

## 2021-05-08 ENCOUNTER — Other Ambulatory Visit: Payer: Self-pay

## 2021-05-08 ENCOUNTER — Inpatient Hospital Stay (HOSPITAL_COMMUNITY)
Admission: RE | Admit: 2021-05-08 | Discharge: 2021-05-14 | DRG: 945 | Disposition: A | Discharge status: Home / Self Care | Payer: Federal, State, Local not specified - PPO | Source: Intra-hospital | Attending: Physical Medicine & Rehabilitation | Admitting: Physical Medicine & Rehabilitation

## 2021-05-08 DIAGNOSIS — S0101XD Laceration without foreign body of scalp, subsequent encounter: Secondary | ICD-10-CM | POA: Diagnosis not present

## 2021-05-08 DIAGNOSIS — F1010 Alcohol abuse, uncomplicated: Secondary | ICD-10-CM | POA: Diagnosis present

## 2021-05-08 DIAGNOSIS — I619 Nontraumatic intracerebral hemorrhage, unspecified: Secondary | ICD-10-CM | POA: Diagnosis present

## 2021-05-08 DIAGNOSIS — R58 Hemorrhage, not elsewhere classified: Secondary | ICD-10-CM | POA: Diagnosis not present

## 2021-05-08 DIAGNOSIS — G4089 Other seizures: Secondary | ICD-10-CM | POA: Diagnosis present

## 2021-05-08 DIAGNOSIS — E871 Hypo-osmolality and hyponatremia: Secondary | ICD-10-CM | POA: Diagnosis present

## 2021-05-08 DIAGNOSIS — S0636AD Traumatic hemorrhage of cerebrum, unspecified, with loss of consciousness status unknown, subsequent encounter: Secondary | ICD-10-CM | POA: Diagnosis present

## 2021-05-08 DIAGNOSIS — S06364A Traumatic hemorrhage of cerebrum, unspecified, with loss of consciousness of 6 hours to 24 hours, initial encounter: Secondary | ICD-10-CM

## 2021-05-08 DIAGNOSIS — S134XXD Sprain of ligaments of cervical spine, subsequent encounter: Secondary | ICD-10-CM

## 2021-05-08 DIAGNOSIS — K5901 Slow transit constipation: Secondary | ICD-10-CM | POA: Diagnosis not present

## 2021-05-08 DIAGNOSIS — S0291XD Unspecified fracture of skull, subsequent encounter for fracture with routine healing: Secondary | ICD-10-CM | POA: Diagnosis not present

## 2021-05-08 DIAGNOSIS — Z79899 Other long term (current) drug therapy: Secondary | ICD-10-CM | POA: Diagnosis not present

## 2021-05-08 DIAGNOSIS — K5900 Constipation, unspecified: Secondary | ICD-10-CM | POA: Diagnosis present

## 2021-05-08 DIAGNOSIS — W102XXD Fall (on)(from) incline, subsequent encounter: Secondary | ICD-10-CM | POA: Diagnosis present

## 2021-05-08 DIAGNOSIS — E876 Hypokalemia: Secondary | ICD-10-CM | POA: Diagnosis present

## 2021-05-08 DIAGNOSIS — S06340S Traumatic hemorrhage of right cerebrum without loss of consciousness, sequela: Secondary | ICD-10-CM | POA: Diagnosis not present

## 2021-05-08 DIAGNOSIS — S069X9D Unspecified intracranial injury with loss of consciousness of unspecified duration, subsequent encounter: Secondary | ICD-10-CM | POA: Diagnosis not present

## 2021-05-08 DIAGNOSIS — S0291XA Unspecified fracture of skull, initial encounter for closed fracture: Secondary | ICD-10-CM

## 2021-05-08 MED ORDER — ALUM & MAG HYDROXIDE-SIMETH 200-200-20 MG/5ML PO SUSP
30.0000 mL | ORAL | Status: DC | PRN
Start: 2021-05-08 — End: 2021-05-14

## 2021-05-08 MED ORDER — ACETAMINOPHEN 325 MG PO TABS
650.0000 mg | ORAL_TABLET | ORAL | Status: DC | PRN
  Administered 2021-05-09 – 2021-05-13 (×7): 650 mg via ORAL
  Filled 2021-05-08 (×7): qty 2

## 2021-05-08 MED ORDER — LEVETIRACETAM 500 MG PO TABS
1000.0000 mg | ORAL_TABLET | Freq: Two times a day (BID) | ORAL | Status: DC
  Administered 2021-05-08 – 2021-05-14 (×12): 1000 mg via ORAL
  Filled 2021-05-08 (×12): qty 2

## 2021-05-08 MED ORDER — SENNOSIDES-DOCUSATE SODIUM 8.6-50 MG PO TABS: 1.0000 | ORAL_TABLET | Freq: Every evening | ORAL | Status: DC | PRN

## 2021-05-08 MED ORDER — ENSURE ENLIVE PO LIQD
237.0000 mL | Freq: Two times a day (BID) | ORAL | Status: DC
  Administered 2021-05-11: 237 mL via ORAL

## 2021-05-08 MED ORDER — ONDANSETRON HCL 4 MG PO TABS: 4.0000 mg | ORAL_TABLET | Freq: Four times a day (QID) | ORAL | Status: DC | PRN

## 2021-05-08 MED ORDER — SORBITOL 70 % SOLN: 30.0000 mL | Freq: Every day | Status: DC | PRN

## 2021-05-08 MED ORDER — HYDROCODONE-ACETAMINOPHEN 5-325 MG PO TABS
1.0000 | ORAL_TABLET | ORAL | Status: DC | PRN
  Administered 2021-05-10 – 2021-05-14 (×11): 1 via ORAL
  Filled 2021-05-08 (×12): qty 1

## 2021-05-08 MED ORDER — METHOCARBAMOL 500 MG PO TABS
500.0000 mg | ORAL_TABLET | Freq: Four times a day (QID) | ORAL | Status: DC | PRN
  Administered 2021-05-10 – 2021-05-11 (×2): 500 mg via ORAL
  Filled 2021-05-08 (×2): qty 1

## 2021-05-08 MED ORDER — PROCHLORPERAZINE MALEATE 5 MG PO TABS
5.0000 mg | ORAL_TABLET | Freq: Four times a day (QID) | ORAL | Status: DC | PRN
  Administered 2021-05-10: 5 mg via ORAL
  Filled 2021-05-08: qty 1

## 2021-05-08 MED ORDER — DIPHENHYDRAMINE HCL 12.5 MG/5ML PO ELIX: 12.5000 mg | ORAL_SOLUTION | Freq: Four times a day (QID) | ORAL | Status: DC | PRN

## 2021-05-08 MED ORDER — SERTRALINE HCL 100 MG PO TABS
100.0000 mg | ORAL_TABLET | Freq: Every day | ORAL | Status: DC
Start: 2021-05-09 — End: 2021-05-11
  Administered 2021-05-09 – 2021-05-11 (×3): 100 mg via ORAL
  Filled 2021-05-08 (×3): qty 1

## 2021-05-08 MED ORDER — PROCHLORPERAZINE EDISYLATE 10 MG/2ML IJ SOLN: 5.0000 mg | Freq: Four times a day (QID) | INTRAMUSCULAR | Status: DC | PRN

## 2021-05-08 MED ORDER — POTASSIUM CHLORIDE CRYS ER 20 MEQ PO TBCR
40.0000 meq | EXTENDED_RELEASE_TABLET | Freq: Every day | ORAL | Status: AC
  Administered 2021-05-08: 40 meq via ORAL
  Filled 2021-05-08 (×2): qty 2

## 2021-05-08 MED ORDER — POLYETHYLENE GLYCOL 3350 17 G PO PACK
17.0000 g | PACK | Freq: Every day | ORAL | Status: DC
  Administered 2021-05-09: 17 g via ORAL
  Filled 2021-05-08: qty 1

## 2021-05-08 MED ORDER — TRAZODONE HCL 50 MG PO TABS
25.0000 mg | ORAL_TABLET | Freq: Every evening | ORAL | Status: DC | PRN
  Administered 2021-05-08 – 2021-05-13 (×6): 50 mg via ORAL
  Filled 2021-05-08 (×6): qty 1

## 2021-05-08 MED ORDER — FOLIC ACID 1 MG PO TABS
1.0000 mg | ORAL_TABLET | Freq: Every day | ORAL | Status: DC
  Administered 2021-05-09 – 2021-05-14 (×6): 1 mg via ORAL
  Filled 2021-05-08 (×6): qty 1

## 2021-05-08 MED ORDER — LEVETIRACETAM 1000 MG PO TABS: 1000.0000 mg | ORAL_TABLET | Freq: Two times a day (BID) | ORAL | Status: DC

## 2021-05-08 MED ORDER — FLEET ENEMA 7-19 GM/118ML RE ENEM: 1.0000 | ENEMA | Freq: Once | RECTAL | Status: DC | PRN

## 2021-05-08 MED ORDER — DOCUSATE SODIUM 100 MG PO CAPS
100.0000 mg | ORAL_CAPSULE | Freq: Every day | ORAL | Status: DC
  Administered 2021-05-09 – 2021-05-11 (×3): 100 mg via ORAL
  Filled 2021-05-08 (×6): qty 1

## 2021-05-08 MED ORDER — GUAIFENESIN-DM 100-10 MG/5ML PO SYRP: 5.0000 mL | ORAL_SOLUTION | Freq: Four times a day (QID) | ORAL | Status: DC | PRN

## 2021-05-08 MED ORDER — ONDANSETRON HCL 4 MG/2ML IJ SOLN: 4.0000 mg | Freq: Four times a day (QID) | INTRAMUSCULAR | Status: DC | PRN

## 2021-05-08 MED ORDER — PROCHLORPERAZINE 25 MG RE SUPP: 12.5000 mg | Freq: Four times a day (QID) | RECTAL | Status: DC | PRN

## 2021-05-08 MED ORDER — HYDROCODONE-ACETAMINOPHEN 5-325 MG PO TABS: 1.0000 | ORAL_TABLET | ORAL | 0 refills | Status: DC | PRN

## 2021-05-08 NOTE — Progress Notes (Signed)
Per Dr. Berline Chough patient may wash hair tomorrow ?

## 2021-05-08 NOTE — H&P (Signed)
?  ?Physical Medicine and Rehabilitation Admission H&P ?  ?  ?CC: Functional deficits secondary to TBI/parenchymal contusion, skull fracture, seizures ?  ?HPI: Peggy Vega is a 51 year old female who presented to [facility] emergency department on 05/04/2021 after a fall.  She had reportedly had been drinking an estimate of 8 beers, was walking up a ramp to surface and she fell backwards hitting the back of her head.  She was noted to have mild shaking by her husband but had a pulse and was breathing.  Several minutes later he described another episode of shaking for about 10 to 15 minutes and she went stiff.  She bit her tongue.  No history of prior seizures.  She was admitted by neurosurgery and evaluated by Dr. Conchita Paris.  CT scan of the head demonstrated a right frontal intraparenchymal hematoma and nondisplaced linear fracture through the right occipital bone and overlying scalp hematoma.  Laboratory work-up revealed severe hyponatremia.  Keppra 500 mg started.  EEG obtained.  Hypertonic saline initiated.  Critical care medicine consulted for further assistance with seizure activity.  Mental status was greatly improved the following day.  Tube feeds via core track given.  Her hyponatremia was corrected and she was ready to transfer to stepdown on 3/29.  She was tolerating a regular diet.The patient requires inpatient physical medicine and rehabilitation evaluations and treatment secondary to dysfunction due to traumatic brain injury, intraparenchymal hemorrhage, skull fracture, tonic clonic seizure. ?  ?Pt reports doesn't remember when had LBM- is very unsure of this.  ?Is voiding well ?Has an occipital HA and neck pain.  ?Denies feeling constipated.  ?Asking to wash hair as soon as possible.  ?  ?Review of Systems  ?Constitutional:  Negative for chills and fever.  ?HENT:  Negative for nosebleeds and sore throat.   ?Eyes:  Negative for blurred vision and double vision.  ?Respiratory:  Negative for cough and  shortness of breath.   ?Cardiovascular:  Negative for chest pain and palpitations.  ?Gastrointestinal:  Negative for abdominal pain, nausea and vomiting.  ?Genitourinary:  Negative for dysuria and urgency.  ?Musculoskeletal: Negative.   ?     Occipital pain/hematoma  ?Neurological:  Negative for dizziness and focal weakness.  ?All other systems reviewed and are negative. ?    ?Past Medical History:  ?Diagnosis Date  ? Depression    ? ETOH abuse    ? Hyponatremia    ? Seizure (HCC)    ?  ?History reviewed. No pertinent surgical history. ?No family history on file. ?Social History:  has no history on file for tobacco use, alcohol use, and drug use. ?Allergies:  ?     ?Allergies  ?Allergen Reactions  ? Penicillins Other (See Comments)  ?    Yeast infection  ? Morphine Itching  ?  ?      ?Medications Prior to Admission  ?Medication Sig Dispense Refill  ? CALCIUM PO Take 1 capsule by mouth daily.      ? cyanocobalamin (,VITAMIN B-12,) 1000 MCG/ML injection Inject 1,000 mcg into the muscle every 30 (thirty) days.      ? Ferrous Sulfate (IRON PO) Take 1 tablet by mouth daily.      ? sertraline (ZOLOFT) 100 MG tablet Take 100 mg by mouth daily.      ? traZODone (DESYREL) 100 MG tablet Take 100 mg by mouth at bedtime as needed for sleep.      ?  ?  ?  ?  ?Home: ?Home Living ?  Family/patient expects to be discharged to:: Private residence ?Living Arrangements: Spouse/significant other, Children (54 year old twins) ?Available Help at Discharge: Family, Available 24 hours/day ?Type of Home: House ?Home Access: Stairs to enter ?Entrance Stairs-Number of Steps: 2 ?Entrance Stairs-Rails: None ?Home Layout: Two level, Bed/bath upstairs ?Alternate Level Stairs-Number of Steps: flight ?Bathroom Shower/Tub: Walk-in shower ?Bathroom Toilet: Standard ?Bathroom Accessibility: Yes ?Home Equipment: None ?Additional Comments: no downstairs bedrooms; lives with husband and 2 of her [name], husband works ? Lives With: Spouse, Family ?   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  ?Functional Status:  ?Mobility: ?Bed Mobility ?Overal bed mobility: Needs Assistance ?Bed Mobility: Sit to Supine ?Supine to sit: Min guard ?Sit to supine: Supervision ?General bed mobility comments: pt able to come up into long sitting from elevated HOB. Min A for safety as pt pivoted to edge as she is with mvmt patterns that are not safe and she has little to no awareness of her deficits ?Transfers ?Overall transfer level: Needs assistance ?Equipment used: None ?Transfers: Sit to/from Stand ?Sit to Stand: Min guard ?Bed to/from chair/wheelchair/BSC transfer type:: Step pivot ?Step pivot transfers: Min assist ?General transfer comment: to steady ?Ambulation/Gait ?Ambulation/Gait assistance: Min assist ?Gait Distance (Feet): 120 Feet ?Assistive device: None ?Gait Pattern/deviations: Step-through pattern ?General Gait Details: pt with slowed step-through gait, non-fluid gait pattern with pauses between steps of each leg. PT attempts to encourage increased gait speed with continuous step-through gait which pt is able to perform for brief periods but she quickly returns to slowed step-through gait with stoppages of gait in between steps ?Gait velocity: reduced ?Gait velocity interpretation: <1.8 ft/sec, indicate of risk for recurrent falls ?  ?ADL: ?ADL ?Overall ADL's : Needs assistance/impaired ?Eating/Feeding: NPO, Sitting ?Grooming: Standing, Minimal assistance ?Grooming Details (indicate cue type and reason): assist for problem solving, cues and standing balance ?Upper Body Bathing: Minimal assistance, Sitting ?Lower Body Bathing: Moderate assistance, +2 for physical assistance, +2 for safety/equipment, Sit to/from stand ?Lower Body Bathing Details (indicate cue type and reason): balance and cues ?Upper Body Dressing : Minimal  assistance, Sitting ?Lower Body Dressing: Moderate assistance, Sit to/from stand ?Lower Body Dressing Details (indicate cue type and reason): able to don socks sitting EOB ?Toilet Transfer: Moderate assistance, Regular Toilet, Ambulation ?Toileting- Clothing Manipulation and Hygiene: Moderate assistance, +2 for physical assistance, +2 for safety/equipment, Sit to/from stand ?Toileting - Clothing Manipulation Details (indicate cue type and reason): pt able to complete hygiene with lateral leans on the toilet, min A however required more assist for thoroughness after BM ?Functional mobility during ADLs: Moderate assistance, +2 for physical assistance, +2 for safety/equipment ?General ADL Comments: poor coordination both uppers and lowers. does better when statically standing than wtih dynamic movements. requires cues for safety, initiation, sequencing and problem solving ?  ?Cognition: ?Cognition ?Overall Cognitive Status: Impaired/Different from baseline ?Arousal/Alertness: Awake/alert ?Orientation Level: Oriented X4 ?Attention: Selective, Sustained ?Sustained Attention: Appears intact ?Selective Attention: Impaired ?Selective Attention Impairment: Verbal complex ?Memory: Impaired ?Memory Impairment: Storage deficit, Decreased short term memory ?Decreased Short Term Memory: Verbal basic ?Awareness: Impaired ?Awareness Impairment: Intellectual impairment, Emergent impairment, Anticipatory impairment ?Problem Solving: Impaired ?Executive Function: Reasoning, Self Monitoring, Self Correcting ?Reasoning: Impaired ?Reasoning Impairment: Verbal complex, Functional complex ?Self Monitoring: Impaired ?Self Monitoring Impairment: Verbal complex, Functional complex ?Self Correcting: Impaired ?Behaviors: Impulsive ?Safety/Judgment: Impaired ?Cognition ?Arousal/Alertness: Awake/alert ?Behavior During Therapy: Flat affect ?Overall Cognitive Status: Impaired/Different from baseline ?Area  of Impairment: Memory, Safety/judgement,  Awareness, Problem solving ?Orientation Level: Disoriented to, Place, Time ?Current Attention Level: Sustained ?Memory: Decreased short-term memory ?Following Commands: Follows one step commands with increased

## 2021-05-08 NOTE — TOC CAGE-AID Note (Signed)
Transition of Care (TOC) - CAGE-AID Screening ? ? ?Patient Details  ?Name: Mckinleigh Schuchart ?MRN: [id number] ?Date of Birth: September 21, 1970 ? ?Transition of Care (TOC) CM/SW Contact:    ?Lam Bjorklund C Tarpley-Carter, LCSWA ?Phone Number: ?05/08/2021, 2:48 PM ? ? ?Clinical Narrative: ?Pt participated in Cage-Aid.  Pt stated she does not use substance or ETOH (socially).  Pt was offered resources, due to usage of ETOH.    ? ?Insurance underwriter, MSW, LCSW-A ?Pronouns:  She/Her/Hers ?[facility]Transitions of [facility] ?Clinical Social Worker ?Direct Number:  [phone] ?[email or web address] ? ?CAGE-AID Screening: ?  ? ?Have You Ever Felt You Ought to Cut Down on Your Drinking or Drug Use?: No ?Have People Annoyed You By Critizing Your Drinking Or Drug Use?: No ?Have You Felt Bad Or Guilty About Your Drinking Or Drug Use?: No ?Have You Ever Had a Drink or Used Drugs First Thing In The Morning to Steady Your Nerves or to Get Rid of a Hangover?: No ?CAGE-AID Score: 0 ? ?Substance Abuse Education Offered: Yes ? ?Substance abuse interventions: Educational Materials ? ? ? ? ? ? ?

## 2021-05-08 NOTE — Progress Notes (Signed)
Speech Language Pathology Treatment: Cognitive-Linquistic  ?Patient Details ?Name: Peggy Vega ?MRN: [id number] ?DOB: 11/11/1970 ?Today's Date: 05/08/2021 ?Time: 5852-7782 ?SLP Time Calculation (min) (ACUTE ONLY): 18 min ? ?Assessment / Plan / Recommendation ?Clinical Impression ? Pt was seen for cognitive-linguistic treatment with her husband present. Pt's cognition subjectively appeared notably improved compared to that noted during the initial evaluation. Pt reported that she has a bachelor's degree and is employed as an Building surveyor. She stated that she believes her cognition is back to baseline, but her husband stated that it is "slower" than PTA. The [facility] Mental Status Examination was completed to more objectively re-assess the pt's cognitive-linguistic skills. She achieved a score of 28/30. Mild cognitive-linguistic difficulty was noted in executive function; errors observed when completing problems related to money and additional processing time was needed for mental flexibility. SLP will continue to follow pt.   ?  ?HPI HPI: Pt is a 51 y.o. female who was brought to the ET by her husband after suffering a fall. Per H&P, pt was somewhat intoxicated, fell backwards while walking, hit the back of her head, lost consciousness, and had a witnessed seizure. Imaging revealed right frontal intraparenchymal hematoma, non-displaced right occipital skull fracture with large right posterior scalp hematoma, no acute cervical spine fracture on CT of the head/neck.  EEG 3/26: moderate diffuse encephalopathy, nonspecific etiology. No seizures or epileptiform discharges. ?  ?   ?SLP Plan ? Continue with current plan of care ? ?  ?  ?Recommendations for follow up therapy are one component of a multi-disciplinary discharge planning process, led by the attending physician.  Recommendations may be updated based on patient status, additional functional criteria and insurance authorization. ?   ? ?Recommendations  ?   ?   ?    ?   ? ? ? ? Follow Up Recommendations: Acute inpatient rehab (3hours/day) ?Assistance recommended at discharge: Set up Supervision/Assistance ?SLP Visit Diagnosis: Cognitive communication deficit (R41.841) ?Plan: Continue with current plan of care ? ? ? ? ?  ?  ?Peggy Vega I. Vear Clock, MS, CCC-SLP ?Acute Rehabilitation Services ?Office number [phone] ?Pager [phone] ? ? ?Scheryl Marten ? ?05/08/2021, 10:11 AM ? ? ? ?

## 2021-05-08 NOTE — Progress Notes (Signed)
Peggy Heys, MD  ?Physician ?Physical Medicine and Rehabilitation ?PMR Pre-admission     ?Signed ?Date of Service:  05/07/2021  3:16 PM ? Related encounter: ED to Hosp-Admission (Discharged) from 05/04/2021 in [place] ?  ?Signed    ?  ?Show:Clear all ?[x] Written[x] Templated[] Copied ? ?Added by: ?[x] Matthieu Loftus, Vertis Kelch, RN[x] Lovorn, Jinny Blossom, MD ? ?[] Hover for details ?   ?   ?   ?   ?   ?   ?   ?   ?   ?   ?   ?   ?   ?   ?   ?   ?   ?   ?   ?   ?   ?   ?   ?   ?   ?   ?   ?   ?   ?   ?   ?   ?   ?   ?   ?   ?   ?   ?   ?   ?   ?   ?   ?   ?   ?   ?   ?   ?   ?   ?   ?   ?   ?   ?   ?   ?   ?   ?   ?   ?   ?   ?   ?   ?   ?   ?   ?   ?   ?   ?   ?   ?   ?   ?   ?   ?   ?   ?   ?   ?   ?   ?   ?   ?   ?   ?   ?   ?   ?   ?   ?   ?   ?   ?   ?   ?   ?   ?   ?   ?   ?   ?   ?   ?   ?   ?   ?   ?   ?   ?   ?   ?   ?   ?   ?   ?   ?   ?   ?   ?   ?   ?   ?   ?   ?   ?   ?   ?   ?   ?   ?   ?   ?   ?   ?   ?   ?   ?PMR Admission Coordinator Pre-Admission Assessment ?  ?Patient: Peggy Vega is an 51 y.o., female ?MRN: [id number] ?DOB: 10/22/1970 ?Height: 5\' 4"  (162.6 cm) ?Weight: 76.9 kg ?  ?Insurance Information ?HMO:     PPO: yes     PCP:      IPA:      80/20:      OTHER:  ?PRIMARY: Federal BCBS      Policy#: [id number]      Subscriber: pt ?CM Name: Joellen Jersey      Phone#: [phone]     Fax#: [phone] ?Pre-Cert#: [id number]  approved until 4/10    Employer: [employer] ?Benefits:  Phone #: [phone]     Name: 3/29 ?Eff. Date: 07/22/2003  Deduct: $350      Out of Pocket Max: $6000      Life Max: none ?CIR: $350 co pay per admission      SNF: $175 co pay per admission; limit 30 days per year ?Outpatient: $25 co apy per visit    Co-Pay: 75 visits combined ?Home Health: 85%      Co-Pay: 50 visits combined ?DME: 85%     Co-Pay: 15% ?Providers: in network ? ?SECONDARY: none ?  ?financial Counselor:       Phone#:  ?  ?The ?Data Collection Information Summary? for patients in Inpatient  Rehabilitation Facilities with attached ?Privacy Act [place] Records? was provided and verbally reviewed with: N/A ?  ?Emergency Contact Information ?Contact Information   ?  ?  Name Relation Home Work Mobile  ?  Taketa,Stephen Spouse     [phone]  ?  Charlynne Pander Friend     [phone]  ?  ?   ?  ?Current Medical History  ?Patient Admitting Diagnosis: Fall, traumatic right frontal hematoma and right occipital fracture ?  ?History of Present Illness: 51 year old female with history of depression and ETOH abuse. Presented on 05/04/2021 with report of a fall. Patient had been drinking and was unsteady on her feet walking up a ramp. In town for a [employer]. The person in front of her stopped and she ran into that person and fell backwards striking her head on the pavement. After fall she began shaking with snoring respirations and was unresponsive. No prior history of seizures. In the ER found to have a right frontal intraparenchymal hematoma, nondisplaced right occipital fracture with large right posterior scalp hematoma. Additional findings of hyponatremia with serum Na+ of 117 and ETOH of 130. She was admitted to ICU by Neurosurgery. Hypertonic saline initiated and EEG ordered. Placed empirically on Keppra.  Confusion felt multifactorial suspect largely driven by TBI with superimposed encephalopathy from resolving hyponatremia, recent seizures and possible alcohol withdrawal.  ?  ?Patient's medical record from [facility] has been reviewed by the rehabilitation admission coordinator and physician. ?  ?Past Medical History  ?    ?Past Medical History:  ?Diagnosis Date  ? Depression    ? ETOH abuse    ? Hyponatremia    ? Seizure ([place])    ?  ?Has the patient had major surgery during 100 days prior to admission? No ?  ?Family History   ?family history is not on file. ?  ?Current Medications ?  ?Current Facility-Administered Medications:  ?  acetaminophen (TYLENOL) tablet 650 mg, 650 mg, Oral,  Q4H PRN, Kipp Brood, MD, 650 mg at 05/06/21 2042 ?  docusate sodium (COLACE) capsule 100 mg, 100 mg, Oral, Daily, Aslam, Sadia, MD ?  feeding supplement (ENSURE ENLIVE / ENSURE PLUS) liquid 237 mL, 237 mL, Oral, BID BM, Consuella Lose, MD, 237 mL at 05/08/21 1006 ?  folic acid (FOLVITE) tablet 1 mg, 1 mg, Oral, Daily, Agarwala, Ravi, MD, 1 mg at 05/08/21 1005 ?  HYDROcodone-acetaminophen (NORCO/VICODIN) 5-325 MG per tablet 1 tablet, 1 tablet, Oral, Q4H PRN, Kipp Brood, MD, 1 tablet at 05/08/21 0659 ?  levETIRAcetam (KEPPRA) tablet 1,000 mg, 1,000 mg, Oral, BID, Agarwala, Ravi, MD, 1,000 mg at 05/08/21 1005 ?  ondansetron (ZOFRAN) tablet 4 mg, 4 mg, Oral, Q6H PRN **OR** ondansetron (ZOFRAN) injection 4 mg, 4 mg, Intravenous, Q6H PRN, Kipp Brood, MD, 4 mg at 05/04/21 [id number] ?  polyethylene glycol (MIRALAX / GLYCOLAX) packet 17 g, 17 g,  Oral, Daily, Aslam, Sadia, MD ?  sertraline (ZOLOFT) tablet 100 mg, 100 mg, Oral, Daily, Agarwala, Ravi, MD, 100 mg at 05/08/21 1005 ?  sodium chloride flush (NS) 0.9 % injection 10-40 mL, 10-40 mL, Intracatheter, Q12H, Agarwala, Ravi, MD, 10 mL at 05/08/21 1006 ?  sodium chloride flush (NS) 0.9 % injection 10-40 mL, 10-40 mL, Intracatheter, PRN, Agarwala, Ravi, MD ?  traZODone (DESYREL) tablet 100 mg, 100 mg, Oral, QHS PRN, Kipp Brood, MD ?  ?Patients Current Diet:  ?Diet Order   ?  ?         ?    Diet - low sodium heart healthy       ?  ?    Diet regular Room service appropriate? Yes with Assist; Fluid consistency: Thin  Diet effective now       ?  ?  ?   ?  ?  ?   ?  ?Precautions / Restrictions ?Precautions ?Precautions: Fall ?Restrictions ?Weight Bearing Restrictions: No  ?  ?Has the patient had 2 or more falls or a fall with injury in the past year? Yes ?  ?Prior Activity Level ?Community (5-7x/wk): independent, working remote, driving ?  ?Prior Functional Level ?Self Care: Did the patient need help bathing, dressing, using the toilet or eating? Independent ?   ?Indoor Mobility: Did the patient need assistance with walking from room to room (with or without device)? Independent ?  ?Stairs: Did the patient need assistance with internal or external stairs (with or without device)? Independent ?  ?Functional Cognition: Did the patient need help planning regular tasks such as shopping or remembering to take medications? Independent ?  ?Patient Information ?Are you of Hispanic, Latino/a,or Spanish origin?: A. No, not of Hispanic, Latino/a, or Spanish origin ?What is your race?: A. White ?Do you need or want an interpreter to communicate with a doctor or health care staff?: 0. No ?  ?Patient's Response To:  ?Health Literacy and Transportation ?Is the patient able to respond to health literacy and transportation needs?: Yes ?Health Literacy - How often do you need to have someone help you when you read instructions, pamphlets, or other written material from your doctor or pharmacy?: Never ?In the past 12 months, has lack of transportation kept you from medical appointments or from getting medications?: No ?In the past 12 months, has lack of transportation kept you from meetings, work, or from getting things needed for daily living?: No ?  ?Home Assistive Devices / Equipment ?Home Equipment: None ?  ?Prior Device Use: Indicate devices/aids used by the patient prior to current illness, exacerbation or injury? None of the above ?  ?Current Functional Level ?Cognition ?  Arousal/Alertness: Awake/alert ?Overall Cognitive Status: Impaired/Different from baseline ?Current Attention Level: Sustained ?Orientation Level: Oriented X4 ?Following Commands: Follows one step commands with increased time ?Safety/Judgement: Decreased awareness of safety, Decreased awareness of deficits ?General Comments: Better orientation to place. ?Attention: Selective, Sustained ?Sustained Attention: Appears intact ?Selective Attention: Impaired ?Selective Attention Impairment: Verbal complex ?Memory:  Impaired ?Memory Impairment: Storage deficit, Decreased short term memory ?Decreased Short Term Memory: Verbal basic ?Awareness: Impaired ?Awareness Impairment: Intellectual impairment, Emergent impairment, Antici

## 2021-05-08 NOTE — Discharge Summary (Signed)
?Physician Discharge Summary  ?Patient ID: ?Peggy Vega ?MRN: [id number] ?DOB/AGE: [birth year] 51 y.o. ? ?Admit date: 05/04/2021 ?Discharge date: 05/08/2021 ? ?Admission Diagnoses:  ?ICH ?Post-traumatic SZ ?Hyponatremia ? ?Discharge Diagnoses:  ?Same ?Principal Problem: ?  ICH (intracerebral hemorrhage) (HCC) ?Active Problems: ?  Malnutrition of moderate degree ? ? ?Discharged Condition: Stable ? ?Hospital Course:  ?Peggy Vega is a 51 y.o. female admitted to the hospital after presented to the ED s/p fall. She was found to have a small parenchymal contusion and non-displaced skull fracture and severe hyponatremia. She was monitored in the ICU and was noted to have a tonic-clonic SZ lasting a few seconds. She was started on Keppra. She was noted to more awake, participative in therapy over the next few days. Her hyponatremia was corrected initially with hypertonic saline. She was transferred to neuro progressive care and ultimately to CIR in stable condition. ? ? ?Discharge Exam: ?Blood pressure 125/75, pulse 79, temperature 98.8 ?F (37.1 ?C), temperature source Oral, resp. rate 16, height 5\' 4"  (1.626 m), weight 76.9 kg, SpO2 97 %. ?Awake, alert, oriented ?Speech fluent, appropriate ?CN grossly intact ?5/5 BUE/BLE ?Wound c/d/i ? ?Disposition: Discharge disposition: 02-Transferred to [facility] ? ? ? ? ? ? ?Discharge Instructions   ? ? Call MD for:  redness, tenderness, or signs of infection (pain, swelling, redness, odor or green/yellow discharge around incision site)   Complete by: As directed ?  ? Call MD for:  temperature >100.4   Complete by: As directed ?  ? Diet - low sodium heart healthy   Complete by: As directed ?  ? Discharge instructions   Complete by: As directed ?  ? Walk at home as much as possible, at least 4 times / day  ? Increase activity slowly   Complete by: As directed ?  ? Lifting restrictions   Complete by: As directed ?  ? No lifting > 10 lbs  ? May shower / Bathe    Complete by: As directed ?  ? 48 hours after surgery  ? May walk up steps   Complete by: As directed ?  ? No wound care   Complete by: As directed ?  ? Other Restrictions   Complete by: As directed ?  ? No bending/twisting at waist  ? ?  ? ?Allergies as of 05/08/2021   ? ?   Reactions  ? Penicillins Other (See Comments)  ? Yeast infection  ? Morphine Itching  ? ?  ? ?  ?Medication List  ?  ? ?TAKE these medications   ? ?CALCIUM PO ?Take 1 capsule by mouth daily. ?  ?cyanocobalamin 1000 MCG/ML injection ?Commonly known as: (VITAMIN B-12) ?Inject 1,000 mcg into the muscle every 30 (thirty) days. ?  ?HYDROcodone-acetaminophen 5-325 MG tablet ?Commonly known as: NORCO/VICODIN ?Take 1 tablet by mouth every 4 (four) hours as needed for moderate pain. ?  ?IRON PO ?Take 1 tablet by mouth daily. ?  ?levETIRAcetam 1000 MG tablet ?Commonly known as: KEPPRA ?Take 1 tablet (1,000 mg total) by mouth 2 (two) times daily. ?  ?sertraline 100 MG tablet ?Commonly known as: ZOLOFT ?Take 100 mg by mouth daily. ?  ?traZODone 100 MG tablet ?Commonly known as: DESYREL ?Take 100 mg by mouth at bedtime as needed for sleep. ?  ? ?  ? ? Follow-up Information   ? ? 05/10/2021, MD Follow up in 1 month(s).   ?Specialty: Neurosurgery ?Contact information: ?[address] ?[address] ?[place]  [address] ?[phone] ? ? ?  ?  ? ?  ?  ? ?  ? ? ?Signed: ?Jackelyn Hoehn ?05/08/2021, 1:08 PM ? ? ?

## 2021-05-08 NOTE — H&P (Signed)
? ? ?Physical Medicine and Rehabilitation Admission H&P ? ?  ?CC: Functional deficits secondary to TBI/parenchymal contusion, skull fracture, seizures ? ?HPI: Peggy Vega is a 51 year old female who presented to [facility] emergency department on 05/04/2021 after a fall.  She had reportedly had been drinking an estimate of 8 beers, was walking up a ramp to surface and she fell backwards hitting the back of her head.  She was noted to have mild shaking by her husband but had a pulse and was breathing.  Several minutes later he described another episode of shaking for about 10 to 15 minutes and she went stiff.  She bit her tongue.  No history of prior seizures.  She was admitted by neurosurgery and evaluated by Dr. Conchita Paris.  CT scan of the head demonstrated a right frontal intraparenchymal hematoma and nondisplaced linear fracture through the right occipital bone and overlying scalp hematoma.  Laboratory work-up revealed severe hyponatremia.  Keppra 500 mg started.  EEG obtained.  Hypertonic saline initiated.  Critical care medicine consulted for further assistance with seizure activity.  Mental status was greatly improved the following day.  Tube feeds via core track given.  Her hyponatremia was corrected and she was ready to transfer to stepdown on 3/29.  She was tolerating a regular diet.The patient requires inpatient physical medicine and rehabilitation evaluations and treatment secondary to dysfunction due to traumatic brain injury, intraparenchymal hemorrhage, skull fracture, tonic clonic seizure. ? ?Pt reports doesn't remember when had LBM- is very unsure of this.  ?Is voiding well ?Has an occipital HA and neck pain.  ?Denies feeling constipated.  ?Asking to wash hair as soon as possible.  ? ?Review of Systems  ?Constitutional:  Negative for chills and fever.  ?HENT:  Negative for nosebleeds and sore throat.   ?Eyes:  Negative for blurred vision and double vision.  ?Respiratory:  Negative for cough and  shortness of breath.   ?Cardiovascular:  Negative for chest pain and palpitations.  ?Gastrointestinal:  Negative for abdominal pain, nausea and vomiting.  ?Genitourinary:  Negative for dysuria and urgency.  ?Musculoskeletal: Negative.   ?     Occipital pain/hematoma  ?Neurological:  Negative for dizziness and focal weakness.  ?All other systems reviewed and are negative. ?Past Medical History:  ?Diagnosis Date  ? Depression   ? ETOH abuse   ? Hyponatremia   ? Seizure (HCC)   ? ?History reviewed. No pertinent surgical history. ?No family history on file. ?Social History:  has no history on file for tobacco use, alcohol use, and drug use. ?Allergies:  ?Allergies  ?Allergen Reactions  ? Penicillins Other (See Comments)  ?  Yeast infection  ? Morphine Itching  ? ?Medications Prior to Admission  ?Medication Sig Dispense Refill  ? CALCIUM PO Take 1 capsule by mouth daily.    ? cyanocobalamin (,VITAMIN B-12,) 1000 MCG/ML injection Inject 1,000 mcg into the muscle every 30 (thirty) days.    ? Ferrous Sulfate (IRON PO) Take 1 tablet by mouth daily.    ? sertraline (ZOLOFT) 100 MG tablet Take 100 mg by mouth daily.    ? traZODone (DESYREL) 100 MG tablet Take 100 mg by mouth at bedtime as needed for sleep.    ? ? ? ? ?Home: ?Home Living ?Family/patient expects to be discharged to:: Private residence ?Living Arrangements: Spouse/significant other, Children (14 year old twins) ?Available Help at Discharge: Family, Available 24 hours/day ?Type of Home: House ?Home Access: Stairs to enter ?Entrance Stairs-Number of Steps: 2 ?Entrance Stairs-Rails:  None ?Home Layout: Two level, Bed/bath upstairs ?Alternate Level Stairs-Number of Steps: flight ?Bathroom Shower/Tub: Walk-in shower ?Bathroom Toilet: Standard ?Bathroom Accessibility: Yes ?Home Equipment: None ?Additional Comments: no downstairs bedrooms; lives with husband and 2 of her Peggy Vega, husband works ? Lives With: Spouse, Family ?  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 ?Functional Status:  ?Mobility: ?Bed Mobility ?Overal bed mobility: Needs Assistance ?Bed Mobility: Sit to Supine ?Supine to sit: Min guard ?Sit to supine: Supervision ?General bed mobility comments: pt able to come up into long sitting from elevated HOB. Min A for safety as pt pivoted to edge as she is with mvmt patterns that are not safe and she has little to no awareness of her deficits ?Transfers ?Overall transfer level: Needs assistance ?Equipment used: None ?Transfers: Sit to/from Stand ?Sit to Stand: Min guard ?Bed to/from chair/wheelchair/BSC transfer type:: Step pivot ?Step pivot transfers: Min assist ?General transfer comment: to steady ?Ambulation/Gait ?Ambulation/Gait assistance: Min assist ?Gait Distance (Feet): 120 Feet ?Assistive device: None ?Gait Pattern/deviations: Step-through pattern ?General Gait Details: pt with slowed step-through gait, non-fluid gait pattern with pauses between steps of each leg. PT attempts to encourage increased gait speed with continuous step-through gait which pt is able to perform for brief periods but she quickly returns to slowed step-through gait with stoppages of gait in between steps ?Gait velocity: reduced ?Gait velocity interpretation: <1.8 ft/sec, indicate of risk for recurrent falls ?  ? ?ADL: ?ADL ?Overall ADL's : Needs assistance/impaired ?Eating/Feeding: NPO, Sitting ?Grooming: Standing, Minimal assistance ?Grooming Details (indicate cue type and reason): assist for problem solving, cues and standing balance ?Upper Body Bathing: Minimal assistance, Sitting ?Lower Body Bathing: Moderate assistance, +2 for physical assistance, +2 for safety/equipment, Sit to/from stand ?Lower Body Bathing Details (indicate cue type and reason): balance and cues ?Upper Body Dressing : Minimal assistance, Sitting ?Lower Body Dressing:  Moderate assistance, Sit to/from stand ?Lower Body Dressing Details (indicate cue type and reason): able to don socks sitting EOB ?Toilet Transfer: Moderate assistance, Regular Toilet, Ambulation ?Toileting- Clothing Manipulation and Hygiene: Moderate assistance, +2 for physical assistance, +2 for safety/equipment, Sit to/from stand ?Toileting - Clothing Manipulation Details (indicate cue type and reason): pt able to complete hygiene with lateral leans on the toilet, min A however required more assist for thoroughness after BM ?Functional mobility during ADLs: Moderate assistance, +2 for physical assistance, +2 for safety/equipment ?General ADL Comments: poor coordination both uppers and lowers. does better when statically standing than wtih dynamic movements. requires cues for safety, initiation, sequencing and problem solving ? ?Cognition: ?Cognition ?Overall Cognitive Status: Impaired/Different from baseline ?Arousal/Alertness: Awake/alert ?Orientation Level: Oriented X4 ?Attention: Selective, Sustained ?Sustained Attention: Appears intact ?Selective Attention: Impaired ?Selective Attention Impairment: Verbal complex ?Memory: Impaired ?Memory Impairment: Storage deficit, Decreased short term memory ?Decreased Short Term Memory: Verbal basic ?Awareness: Impaired ?Awareness Impairment: Intellectual impairment, Emergent impairment, Anticipatory impairment ?Problem Solving: Impaired ?Executive Function: Reasoning, Self Monitoring, Self Correcting ?Reasoning: Impaired ?Reasoning Impairment: Verbal complex, Functional complex ?Self Monitoring: Impaired ?Self Monitoring Impairment: Verbal complex, Functional complex ?Self Correcting: Impaired ?Behaviors: Impulsive ?Safety/Judgment: Impaired ?Cognition ?Arousal/Alertness: Awake/alert ?Behavior During Therapy: Flat affect ?Overall Cognitive Status: Impaired/Different from baseline ?Area of Impairment: Memory, Safety/judgement, Awareness, Problem solving ?Orientation  Level: Disoriented to, Place, Time ?Current Attention Level: Sustained ?Memory: Decreased short-term memory ?Following Commands: Follows one step commands with increased time ?Safety/Judgement: Decreased awareness

## 2021-05-08 NOTE — Progress Notes (Signed)
Physical Therapy Treatment ?Patient Details ?Name: Peggy Vega ?MRN: [id number] ?DOB: 05-14-1970 ?Today's Date: 05/08/2021 ? ? ?History of Present Illness 51 y.o. female presents to [facility] hospital s/p fall on 05/04/2021, + ETOH. Pt with seizure-like activity after fall. CT head demonstrates right frontal intracerebral hemorrhage and occipital skull fracture. PMH includes ETOH abuse, depression. ? ?  ?PT Comments  ? ? Pt looks to have made significant progress toward goals lately.  Emphasis on transitions, sit to stands, balance activity and progression of gait stability, stamina. ?   ?Recommendations for follow up therapy are one component of a multi-disciplinary discharge planning process, led by the attending physician.  Recommendations may be updated based on patient status, additional functional criteria and insurance authorization. ? ?Follow Up Recommendations ? Acute inpatient rehab (3hours/day) ?  ?  ?Assistance Recommended at Discharge Frequent or constant Supervision/Assistance  ?Patient can return home with the following A little help with walking and/or transfers;A little help with bathing/dressing/bathroom;Assistance with cooking/housework;Direct supervision/assist for medications management;Direct supervision/assist for financial management;Assist for transportation;Help with stairs or ramp for entrance ?  ?Equipment Recommendations ? Other (comment)  ?  ?Recommendations for Other Services Rehab consult ? ? ?  ?Precautions / Restrictions Precautions ?Precautions: Fall  ?  ? ?Mobility ? Bed Mobility ?Overal bed mobility: Needs Assistance ?Bed Mobility: Supine to Sit ?  ?  ?Supine to sit: Supervision ?  ?  ?  ?  ? ?Transfers ?Overall transfer level: Needs assistance ?  ?Transfers: Sit to/from Stand ?Sit to Stand: Min guard ?  ?  ?  ?  ?  ?General transfer comment: safety ?  ? ?Ambulation/Gait ?Ambulation/Gait assistance: Min assist ?Gait Distance (Feet): 150 Feet ?Assistive device: None ?Gait  Pattern/deviations: Step-through pattern ?Gait velocity: reduced ?Gait velocity interpretation: <1.8 ft/sec, indicate of risk for recurrent falls ?  ?General Gait Details: pt with mildly unsteady gait, but less halted than last session.  Pt rather guarded wanting to frequently reach for the rail.  worsening with scanning and noticed some degradation with fatigue. ? ? ?Stairs ?  ?  ?  ?  ?  ? ? ?Wheelchair Mobility ?  ? ?Modified Rankin (Stroke Patients Only) ?  ? ? ?  ?Balance   ?  ?Sitting balance-Leahy Scale: Good ?  ?  ?  ?Standing balance-Leahy Scale: Fair ?  ?  ?  ?  ?  ?  ?  ?  ?  ?Standardized Balance Assessment ?Standardized Balance Assessment : Sharlene Motts Balance Test ?Sharlene Motts Balance Test ?Sit to Stand: Able to stand  independently using hands ?Standing Unsupported: Able to stand 2 minutes with supervision ?Sitting with Back Unsupported but Feet Supported on Floor or Stool: Able to sit safely and securely 2 minutes ?Stand to Sit: Sits safely with minimal use of hands ?Transfers: Able to transfer safely, definite need of hands ?Standing Unsupported with Eyes Closed: Able to stand 10 seconds with supervision ?Standing Ubsupported with Feet Together: Able to place feet together independently but unable to hold for 30 seconds ?From Standing, Reach Forward with Outstretched Arm: Can reach confidently >25 cm (10") ?From Standing Position, Pick up Object from Floor: Able to pick up shoe, needs supervision ?From Standing Position, Turn to Look Behind Over each Shoulder: Looks behind from both sides and weight shifts well ?Turn 360 Degrees: Able to turn 360 degrees safely but slowly ?Standing Unsupported, Alternately Place Feet on Step/Stool: Able to stand independently and complete 8 steps >20 seconds ?Standing Unsupported, One Foot in Front: Able to  take small step independently and hold 30 seconds ?Standing on One Leg: Tries to lift leg/unable to hold 3 seconds but remains standing independently ?Total Score: 41 ?  ?   ? ?  ?Cognition Arousal/Alertness: Awake/alert ?Behavior During Therapy: [facility] for tasks assessed/performed, Flat affect ?Overall Cognitive Status:  (NT formally) ?  ?  ?  ?  ?  ?  ?  ?  ?  ?  ?  ?  ?  ?  ?  ?  ?  ?  ?  ? ?  ?Exercises   ? ?  ?General Comments   ?  ?  ? ?Pertinent Vitals/Pain Pain Assessment ?Pain Assessment: Faces ?Faces Pain Scale: Hurts a little bit ?Pain Location: head ?Pain Descriptors / Indicators: Aching ?Pain Intervention(s): Monitored during session  ? ? ?Home Living   ?  ?  ?  ?  ?  ?  ?  ?  ?  ?   ?  ?Prior Function    ?  ?  ?   ? ?PT Goals (current goals can now be found in the care plan section) Acute Rehab PT Goals ?Patient Stated Goal: return to PLOF ?PT Goal Formulation: With patient/family ?Time For Goal Achievement: 05/20/21 ?Potential to Achieve Goals: Good ?Progress towards PT goals: Progressing toward goals ? ?  ?Frequency ? ? ? Min 4X/week ? ? ? ?  ?PT Plan Current plan remains appropriate  ? ? ?Co-evaluation   ?  ?  ?  ?  ? ?  ?AM-PAC PT "6 Clicks" Mobility   ?Outcome Measure ? Help needed turning from your back to your side while in a flat bed without using bedrails?: None ?Help needed moving from lying on your back to sitting on the side of a flat bed without using bedrails?: A Little ?Help needed moving to and from a bed to a chair (including a wheelchair)?: A Little ?Help needed standing up from a chair using your arms (e.g., wheelchair or bedside chair)?: A Little ?Help needed to walk in hospital room?: A Little ?Help needed climbing 3-5 steps with a railing? : A Lot ?6 Click Score: 18 ? ?  ?End of Session   ?Activity Tolerance: Patient tolerated treatment well ?Patient left: in bed;with call bell/phone within reach;with bed alarm set ?Nurse Communication: Mobility status ?PT Visit Diagnosis: Unsteadiness on feet (R26.81);Other abnormalities of gait and mobility (R26.89);Difficulty in walking, not elsewhere classified (R26.2) ?  ? ? ?Time: 6789-3810 ?PT Time Calculation  (min) (ACUTE ONLY): 29 min ? ?Charges:  $Gait Training: 8-22 mins ?$Therapeutic Activity: 8-22 mins          ?          ? ?05/08/2021 ? ?Jacinto Halim., PT ?Acute Rehabilitation Services ?[phone]  (pager) ?[phone]  (office) ? ? ?Eliseo Gum Ligaya Cormier ?05/08/2021, 6:03 PM ? ?

## 2021-05-08 NOTE — Progress Notes (Signed)
Inpatient Rehabilitation Admissions Coordinator  ? ?I await insurance approval for possible CIR admit. ? ?Ottie Glazier, RN, MSN ?Rehab Admissions Coordinator ?[phone] ?05/08/2021 8:59 AM ? ?

## 2021-05-08 NOTE — Discharge Summary (Signed)
Physician Discharge Summary  ?Patient ID: ?Peggy Vega ?MRN: [id number] ?DOB/AGE: 1970-03-24 51 y.o. ? ?Admit date: 05/08/2021 ?Discharge date: 05/14/2021 ? ?Discharge Diagnoses:  ?Principal Problem: ?  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 ? ?Discharged Condition: good ? ?Significant Diagnostic Studies: ?CT HEAD WO CONTRAST ( ) ? ?Result Date: 05/04/2021 ?CLINICAL DATA:  Head trauma, altered mental status EXAM: CT HEAD WITHOUT CONTRAST TECHNIQUE: Contiguous axial images were obtained from the base of the skull through the vertex without intravenous contrast. RADIATION DOSE REDUCTION: This exam was performed according to the departmental dose-optimization program which includes automated exposure control, adjustment of the mA and/or kV according to patient size and/or use of iterative reconstruction technique. COMPARISON:  CT head obtained earlier the same day FINDINGS: Image quality is degraded by motion artifact. Brain: The intraparenchymal hematoma centered in the right frontal lobe measuring up to 4.3 cm AP x 1.8 cm cc in the sagittal plane is not significantly changed in size when remeasured using similar technique. Smaller adjacent hemorrhage in the gyrus rectus is also not significantly changed (8-18, 10-30). There is mild surrounding edema without significant mass effect or midline shift. There is no new acute intracranial hemorrhage. There is no evidence of territorial infarct. The ventricles are stable in size. There is no midline shift. Vascular: No hyperdense vessel or unexpected calcification. Skull: A nondisplaced right occipital skull fracture is again seen. Sinuses/Orbits: The paranasal sinuses are clear. The globes and orbits are unremarkable. Other: Is significant soft tissue swelling over the right occiput. IMPRESSION: 1.  Significantly motion degraded exam. Within this confine, no significant interval change in size of the intraparenchymal hematoma centered in the right frontal lobe with mild surrounding edema but no midline shift. 2. Unchanged nondisplaced right occipital skull fracture and overlying scalp swelling. Electronically Signed   By: Lesia Hausen M.D.   On: 05/04/2021 18:11  ? ?CT Head Wo Contrast ? ?Result Date: 05/04/2021 ?CLINICAL DATA:  Head trauma EXAM: CT HEAD WITHOUT CONTRAST CT CERVICAL SPINE WITHOUT CONTRAST TECHNIQUE: Multidetector CT imaging of the head and cervical spine was performed following the standard protocol without intravenous contrast. Multiplanar CT image reconstructions of the cervical spine were also generated. RADIATION DOSE REDUCTION: This exam was performed according to the departmental dose-optimization program which includes automated exposure control, adjustment of the mA and/or kV according to patient size and/or use of iterative reconstruction technique. COMPARISON:  None. FINDINGS: CT HEAD FINDINGS Brain: There is a large hemorrhagic contusion within the right frontal pole, measuring 3.2 x 1.6 cm. There is a small amount of subdural blood along the anterior falx cerebri. The size and configuration of the ventricles and extra-axial CSF spaces are normal. Vascular: No abnormal hyperdensity of the major intracranial arteries or dural venous sinuses. No intracranial atherosclerosis. Skull: Large right posterior scalp hematoma. Nondisplaced right occipital skull fracture. Sinuses/Orbits: No fluid levels or advanced mucosal thickening of the visualized paranasal sinuses. No mastoid or middle ear effusion. The orbits are normal. CT CERVICAL SPINE FINDINGS Motion degraded images. Alignment: No static subluxation. Facets are aligned. Occipital condyles are normally positioned. Skull base and vertebrae: No acute fracture. Soft tissues and spinal canal: No prevertebral fluid or swelling. No visible  canal hematoma. Disc levels: No advanced spinal canal or neural foraminal stenosis. Upper chest: No pneumothorax, pulmonary nodule or pleural effusion. Other: Normal visualized paraspinal cervical soft tissues. IMPRESSION: 1. Large  hemorrhagic contusion within the right frontal pole, measuring 3.2 x 1.6 cm. 2. Nondisplaced right occipital skull fracture with large right posterior scalp hematoma. 3. Motion degraded cervical spine images, but no visible acute fracture or static subluxation of the cervical spine. Critical Value/emergent results were called by telephone at the time of interpretation on 05/04/2021 at 1:56 am and relayed to provider [facility], via nurse. Electronically Signed   By: Deatra Robinson M.D.   On: 05/04/2021 01:57  ? ?CT Cervical Spine Wo Contrast ? ?Result Date: 05/04/2021 ?CLINICAL DATA:  Head trauma EXAM: CT HEAD WITHOUT CONTRAST CT CERVICAL SPINE WITHOUT CONTRAST TECHNIQUE: Multidetector CT imaging of the head and cervical spine was performed following the standard protocol without intravenous contrast. Multiplanar CT image reconstructions of the cervical spine were also generated. RADIATION DOSE REDUCTION: This exam was performed according to the departmental dose-optimization program which includes automated exposure control, adjustment of the mA and/or kV according to patient size and/or use of iterative reconstruction technique. COMPARISON:  None. FINDINGS: CT HEAD FINDINGS Brain: There is a large hemorrhagic contusion within the right frontal pole, measuring 3.2 x 1.6 cm. There is a small amount of subdural blood along the anterior falx cerebri. The size and configuration of the ventricles and extra-axial CSF spaces are normal. Vascular: No abnormal hyperdensity of the major intracranial arteries or dural venous sinuses. No intracranial atherosclerosis. Skull: Large right posterior scalp hematoma. Nondisplaced right occipital skull fracture. Sinuses/Orbits: No fluid levels or advanced  mucosal thickening of the visualized paranasal sinuses. No mastoid or middle ear effusion. The orbits are normal. CT CERVICAL SPINE FINDINGS Motion degraded images. Alignment: No static subluxation. Facets are aligned. Occipital condyles are normally positioned. Skull base and vertebrae: No acute fracture. Soft tissues and spinal canal: No prevertebral fluid or swelling. No visible canal hematoma. Disc levels: No advanced spinal canal or neural foraminal stenosis. Upper chest: No pneumothorax, pulmonary nodule or pleural effusion. Other: Normal visualized paraspinal cervical soft tissues. IMPRESSION: 1. Large hemorrhagic contusion within the right frontal pole, measuring 3.2 x 1.6 cm. 2. Nondisplaced right occipital skull fracture with large right posterior scalp hematoma. 3. Motion degraded cervical spine images, but no visible acute fracture or static subluxation of the cervical spine. Critical Value/emergent results were called by telephone at the time of interpretation on 05/04/2021 at 1:56 am and relayed to provider [facility], via nurse. Electronically Signed   By: Deatra Robinson M.D.   On: 05/04/2021 01:57  ? ?DG Chest Portable 1 View ? ?Result Date: 05/04/2021 ?CLINICAL DATA:  Syncope and subsequent fall. EXAM: PORTABLE CHEST 1 VIEW COMPARISON:  None. FINDINGS: The heart size and mediastinal contours are within normal limits. Both lungs are clear. The visualized skeletal structures are unremarkable. IMPRESSION: No active disease. Electronically Signed   By: Aram Candela M.D.   On: 05/04/2021 00:44  ? ?DG Abd Portable 1V ? ?Result Date: 05/05/2021 ?CLINICAL DATA:  Placement of enteric tube EXAM: PORTABLE ABDOMEN - 1 VIEW COMPARISON:  None. FINDINGS: Tip of enteric tube is seen in the region of body of stomach. Bowel gas pattern in the upper abdomen is unremarkable. Lower abdomen and pelvis are not included in the images. Visualized lower lung fields are clear. IMPRESSION: Tip of enteric tube is seen in the  stomach. Electronically Signed   By: Ernie Avena M.D.   On: 05/05/2021 15:06  ? ?EEG adult ? ?Result Date: 05/04/2021 ?Charlsie Quest, MD     05/04/2021  1:06 PM Patient Name: [place]  Zachery DauerLiana Azo

## 2021-05-08 NOTE — Progress Notes (Signed)
Inpatient Rehabilitation Admission Medication Review by a Pharmacist ? ?A complete drug regimen review was completed for this patient to identify any potential clinically significant medication issues. ? ?High Risk Drug Classes Is patient taking? Indication by Medication  ?Antipsychotic Yes Compazine for N/V  ?Anticoagulant No   ?Antibiotic No   ?Opioid Yes Vicodin for pain  ?Antiplatelet No   ?Hypoglycemics/insulin No   ?Vasoactive Medication No   ?Chemotherapy No   ?Other Yes Keppra for seizure prophylaxis  ?Zoloft for mood  ? ? ? ?Type of Medication Issue Identified Description of Issue Recommendation(s)  ?Drug Interaction(s) (clinically significant) ?    ?Duplicate Therapy ?    ?Allergy ?    ?No Medication Administration End Date ?    ?Incorrect Dose ?    ?Additional Drug Therapy Needed ?    ?Significant med changes from prior encounter (inform family/care partners about these prior to discharge).    ?Other ?    ? ? ?Clinically significant medication issues were identified that warrant physician communication and completion of prescribed/recommended actions by midnight of the next day:  No ? ?Pharmacist comments: None ? ?Time spent performing this drug regimen review (minutes):  20 minutes ? ? ?Elwin Sleight ?05/08/2021 4:10 PM ?

## 2021-05-09 DIAGNOSIS — S0291XA Unspecified fracture of skull, initial encounter for closed fracture: Principal | ICD-10-CM

## 2021-05-09 LAB — CBC WITH DIFFERENTIAL/PLATELET
Abs Immature Granulocytes: 0.03 10*3/uL (ref 0.00–0.07)
Basophils Absolute: 0.1 10*3/uL (ref 0.0–0.1)
Basophils Relative: 1 %
Eosinophils Absolute: 0.3 10*3/uL (ref 0.0–0.5)
Eosinophils Relative: 4 %
HCT: 39.2 % (ref 36.0–46.0)
Hemoglobin: 13.7 g/dL (ref 12.0–15.0)
Immature Granulocytes: 0 %
Lymphocytes Relative: 17 %
Lymphs Abs: 1.5 10*3/uL (ref 0.7–4.0)
MCH: 31.9 pg (ref 26.0–34.0)
MCHC: 34.9 g/dL (ref 30.0–36.0)
MCV: 91.4 fL (ref 80.0–100.0)
Monocytes Absolute: 0.9 10*3/uL (ref 0.1–1.0)
Monocytes Relative: 10 %
Neutro Abs: 5.9 10*3/uL (ref 1.7–7.7)
Neutrophils Relative %: 68 %
Platelets: 285 10*3/uL (ref 150–400)
RBC: 4.29 MIL/uL (ref 3.87–5.11)
RDW: 12.5 % (ref 11.5–15.5)
WBC: 8.7 10*3/uL (ref 4.0–10.5)
nRBC: 0 % (ref 0.0–0.2)

## 2021-05-09 LAB — COMPREHENSIVE METABOLIC PANEL
ALT: 18 U/L (ref 0–44)
AST: 15 U/L (ref 15–41)
Albumin: 3.8 g/dL (ref 3.5–5.0)
Alkaline Phosphatase: 83 U/L (ref 38–126)
Anion gap: 10 (ref 5–15)
BUN: 12 mg/dL (ref 6–20)
CO2: 25 mmol/L (ref 22–32)
Calcium: 9.8 mg/dL (ref 8.9–10.3)
Chloride: 97 mmol/L — ABNORMAL LOW (ref 98–111)
Creatinine, Ser: 0.68 mg/dL (ref 0.44–1.00)
GFR, Estimated: >60 mL/min (ref >60)
Glucose, Bld: 105 mg/dL — ABNORMAL HIGH (ref 70–99)
Potassium: 4.8 mmol/L (ref 3.5–5.1)
Sodium: 132 mmol/L — ABNORMAL LOW (ref 135–145)
Total Bilirubin: 0.6 mg/dL (ref 0.3–1.2)
Total Protein: 6.8 g/dL (ref 6.5–8.1)

## 2021-05-09 MED ORDER — NON FORMULARY: 3.0000 mg | Freq: Every morning | Status: DC

## 2021-05-09 MED ORDER — LIDOCAINE 5 % EX PTCH
1.0000 | MEDICATED_PATCH | CUTANEOUS | Status: DC
  Administered 2021-05-09 – 2021-05-13 (×5): 1 via TRANSDERMAL
  Filled 2021-05-09 (×5): qty 1

## 2021-05-09 MED ORDER — PLECANATIDE 3 MG PO TABS
3.0000 mg | ORAL_TABLET | Freq: Every day | ORAL | Status: DC
  Administered 2021-05-11 – 2021-05-14 (×4): 3 mg via ORAL
  Filled 2021-05-09 (×5): qty 1

## 2021-05-09 MED ORDER — POLYETHYLENE GLYCOL 3350 17 G PO PACK
17.0000 g | PACK | Freq: Two times a day (BID) | ORAL | Status: DC
  Administered 2021-05-09 – 2021-05-12 (×7): 17 g via ORAL
  Filled 2021-05-09 (×10): qty 1

## 2021-05-09 NOTE — IPOC Note (Signed)
Overall Plan of Care (IPOC) ?Patient Details ?Name: Peggy Vega ?MRN: [id number] ?DOB: 05/07/1970 ? 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 ?Hospital Problems: Principal Problem: ?  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 ? ? ? Functional Problem List: ?Nursing Behavior, Endurance, Motor, Pain, Safety  ?PT Balance, Motor, Pain, Safety  ?OT Balance, Cognition, Endurance, Motor, Pain, Perception, Safety, Sensory, Skin Integrity, Vision  ?SLP Cognition, Linguistic  ?TR    ?    ? Basic ADL?s: ?OT Grooming, Bathing, Dressing, Toileting  ? ?  Advanced  ADL?s: ?OT Simple Meal Preparation, Laundry, Light Housekeeping  ?   ?Transfers: ?PT Bed Mobility, Bed to Chair, Car, Furniture, Floor  ?OT Toilet, Tub/Shower  ? ?  Locomotion: ?PT Ambulation, Stairs  ? ?  Additional Impairments: ?OT None  ?SLP Social Cognition ?  ?Memory, Problem Solving, Awareness  ?TR    ? ? ?Anticipated Outcomes ?Item Anticipated Outcome  ?Self Feeding MOD I  ?Swallowing ?   ?  ?Basic self-care ? MOD I  ?Toileting ? MOD I ?  ?Bathroom Transfers MOD I  ?Bowel/Bladder ? n/a  ?Transfers ? independent  ?Locomotion ? supervision  ?Communication ?    ?Cognition ? sup A  ?Pain ? < 3  ?Safety/Judgment ? supervision  ? ?Therapy Plan: ?PT Intensity: Minimum of 1-2 x/day ,45 to 90 minutes ?PT Frequency: 5 out of 7 days ?PT Duration Estimated Length of Stay: 7days ?OT Intensity: Minimum of 1-2 x/day, 45 to 90 minutes ?OT Frequency: 5 out of 7 days ?OT Duration/Estimated Length of Stay: 7-10 ?SLP Intensity: Minumum of 1-2 x/day, 30 to 90 minutes ?SLP Frequency: 3 to 5 out of 7 days ?SLP Duration/Estimated Length of Stay: 7 days  ? ?Due to the current state of emergency, patients may not be receiving their 3-hours of Medicare-mandated therapy. ? ? Team Interventions: ?Nursing Interventions Patient/Family Education, Disease Management/Prevention, Pain Management, Medication Management,  Discharge Planning  ?PT interventions Ambulation/gait training, Community reintegration, Fish farm manager, Neuromuscular re-education, Psychosocial support, Museum/gallery curator, Warden/ranger, Discharge planning, Pain management, Therapeutic Activities, UE/LE Coordination activities, Cognitive remediation/compensation, Disease management/prevention, Functional mobility training, Patient/family education, Therapeutic Exercise  ?OT Interventions Balance/vestibular training, Discharge planning, Functional electrical stimulation, Pain management, Self Care/advanced ADL retraining, Therapeutic Activities, UE/LE Coordination activities, Visual/perceptual remediation/compensation, Therapeutic Exercise, Skin care/wound managment, Patient/family education, Functional mobility training, Disease mangement/prevention, Cognitive remediation/compensation, Community reintegration, DME/adaptive equipment instruction, Neuromuscular re-education, Psychosocial support, Splinting/orthotics, UE/LE Strength taining/ROM, Wheelchair propulsion/positioning  ?SLP Interventions Cognitive remediation/compensation, Internal/external aids, Cueing hierarchy, Functional tasks, Patient/family education, Therapeutic Activities  ?TR Interventions    ?SW/CM Interventions Discharge Planning, Psychosocial Support, Patient/Family Education  ? ?Barriers to Discharge ?MD  Medical stability  ?Nursing Home environment access/layout, Behavior ?2 level, 2 steps, no rails. Bedroom/bathroom upstairs. Spouse and family can provide assist 24/7.  ?PT Inaccessible home environment, Decreased caregiver support ?assist not available [place]  ?OT Decreased caregiver support, Lack of/limited family support ?   ?SLP   ?   ?SW   ?   ? ?Team Discharge Planning: ?Destination: PT-Home ,OT- Home , SLP-Home ?Projected Follow-up: PT-Outpatient PT, OT-  24 hour supervision/assistance, Outpatient OT, SLP-Outpatient SLP ?Projected Equipment Needs:  PT-To be determined, OT- Tub/shower seat, SLP-None recommended by SLP ?Equipment Details: PT- , OT-  ?Patient/family involved in discharge planning: PT- Patient,  OT-Patient, SLP-Patient ? ?MD ELOS: 7 days ?Medical Rehab Prognosis:  Excellent ?Assessment: The patient has been admitted for CIR therapies with the diagnosis  of TBI. The team will be addressing functional mobility, strength, stamina, balance, safety, adaptive techniques and equipment, self-care, bowel and bladder mgt, patient and caregiver education, NMR, cognition, pain mgt, community reentry. Goals have been set at supervision to mod I with mobility and self-care, cognition. Anticipated discharge destination is home with family. ? ?Due to the current state of emergency, patients may not be receiving their 3 hours per day of Medicare-mandated therapy.  ? ? ?Ranelle Oyster, MD, FAAPMR   ? ? ?See Team Conference Notes for weekly updates to the plan of care  ?

## 2021-05-09 NOTE — Evaluation (Signed)
Speech Language Pathology Assessment and Plan ? ?Patient Details  ?Name: Peggy Vega ?MRN: [id number] ?Date of Birth: 02-13-1970 ? ?SLP Diagnosis: Cognitive Impairments  ?Rehab Potential: Good ?ELOS: 7 days  ? ?Today's Date: 05/09/2021 ?SLP Individual Time: 8850-2774 ?SLP Individual Time Calculation (min): 60 min ? ?Hospital Problem: Principal Problem: ?  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 ?Past Medical History:  ?Past Medical History:  ?Diagnosis Date  ? Depression   ? ETOH abuse   ? Hyponatremia   ? Seizure ([place])   ? ?Past Surgical History: History reviewed. No pertinent surgical history. ? ?Assessment / Plan / Recommendation ?Clinical Impression Peggy Vega is a 51 year old female who presented to [facility] emergency department on 05/04/2021 after a fall.  She had reportedly had been drinking an estimate of 8 beers, was walking up a ramp to surface and she fell backwards hitting the back of her head.  She was noted to have mild shaking by her husband but had a pulse and was breathing.  Several minutes later he described another episode of shaking for about 10 to 15 minutes and she went stiff.  She bit her tongue.  No history of prior seizures.  She was admitted by neurosurgery and evaluated by Dr. Kathyrn Sheriff.  CT scan of the head demonstrated a right frontal intraparenchymal hematoma and nondisplaced linear fracture through the right occipital bone and overlying scalp hematoma.  Laboratory work-up revealed severe hyponatremia.  Keppra 500 mg started.  EEG obtained.  Hypertonic saline initiated.  Critical care medicine consulted for further assistance with seizure activity.  Mental status was greatly improved the following day.  Tube feeds via core track given.  Her hyponatremia was corrected and she was ready to transfer to stepdown on 3/29.  She was tolerating a regular diet.The patient requires inpatient physical medicine and rehabilitation evaluations and treatment secondary to  dysfunction due to traumatic brain injury, intraparenchymal hemorrhage, skull fracture, tonic clonic seizure. ? ?Patient demonstrates mild cognitive impairments characterized by decreased problem solving, recall of functional information, calculations, executive functions, and anticipatory awareness. Behavior most consistent with Rancho level VII. SLP administered the Cognistat and patient scored within the average range for orientation, attention, and judgement; mild impairment in short-term memory, calculations, constructional ability, and reasoning. Pt had no recall of working with SLP in acute care nor completing cognitive assessment via SLUMS yesterday prior to transition to CIR. Patient was not oriented to place however this may attribute to unfamiliarity with town, as pt lives 3.5 hours away. Pt benefited from increased processing time for responding to questions and intermittent clarification needed during basic conversation exchange. Verbal expression appeared [facility] for all tasks assessed and patient was 100% intelligible at the conversation level. Patient would benefit from skilled SLP intervention to maximize her cognitive functioning and overall functional independence prior to discharge.  ?  ?Skilled Therapeutic Interventions          Pt participated in the Cognistat as well as further non-standardized assessments of cognitive-linguistic, speech, and language function. Please see above.     ?SLP Assessment ? Patient will need skilled Speech Lanaguage Pathology Services during CIR admission  ?  ?Recommendations ? Oral Care Recommendations: Oral care BID ?Recommendations for Other Services: Neuropsych consult ?Patient destination: Home ?Follow up Recommendations: Outpatient SLP ?Equipment Recommended: None recommended by SLP  ?  ?SLP Frequency 3 to 5 out of 7 days   ?SLP Duration ? ?SLP Intensity ? ?SLP Treatment/Interventions 7 days ? ?Minumum of 1-2  x/day, 30 to 90 minutes ? ?Cognitive  remediation/compensation;Internal/external aids;Cueing hierarchy;Functional tasks;Patient/family education;Therapeutic Activities   ? ?Pain ?Pain Assessment ?Pain Scale: 0-10 ?Pain Score: 5  ?Pain Location: Head ?Pain Orientation: Posterior ?Pain Descriptors / Indicators: Headache ?Pain Onset: On-going ? ?Prior Functioning ?Cognitive/Linguistic Baseline: Within functional limits ?Type of Home: House ? Lives With: Spouse;Family ?Available Help at Discharge: Family;Available 24 hours/day ?Education: Bachelor's ?Vocation: Full time employment ? ?SLP Evaluation ?Cognition ?Overall Cognitive Status: Impaired/Different from baseline ?Arousal/Alertness: Awake/alert ?Orientation Level: Oriented X4 ?Year: 2023 ?Month: March ?Day of Week: Correct ?Sustained Attention: Appears intact ?Selective Attention: Impaired ?Selective Attention Impairment: Verbal complex ?Memory: Impaired ?Memory Impairment: Storage deficit;Decreased short term memory ?Decreased Short Term Memory: Verbal basic ?Awareness: Impaired ?Awareness Impairment: Emergent impairment;Anticipatory impairment ?Problem Solving: Impaired ?Problem Solving Impairment: Verbal complex ?Executive Function: Reasoning;Self Monitoring;Self Correcting ?Reasoning: Impaired ?Reasoning Impairment: Verbal complex;Functional complex ?Self Monitoring: Impaired ?Self Monitoring Impairment: Verbal complex;Functional complex ?Self Correcting: Impaired ?Self Correcting Impairment: Verbal complex;Functional complex ?Behaviors: Impulsive ?Safety/Judgment: Impaired ?Ranch[place] [employer] Scales of Cognitive Functioning: Automatic/appropriate  ?Comprehension ?Auditory Comprehension ?Overall Auditory Comprehension: Appears within functional limits for tasks assessed ?EffectiveTechniques: Extra processing time;Repetition ?Expression ?Verbal Expression ?Overall Verbal Expression: Appears within functional limits for tasks assessed ?Oral Motor ?  ? ?Care Tool ?Care Tool Cognition ?Ability to hear  (with hearing aid or hearing appliances if normally used Ability to hear (with hearing aid or hearing appliances if normally used): 0. Adequate - no difficulty in normal conservation, social interaction, listening to TV ?  ?Expression of Ideas and Wants Expression of Ideas and Wants: 3. Some difficulty - exhibits some difficulty with expressing needs and ideas (e.g, some words or finishing thoughts) or speech is not clear ?  ?Understanding Verbal and Non-Verbal Content Understanding Verbal and Non-Verbal Content: 3. Usually understands - understands most conversations, but misses some part/intent of message. Requires cues at times to understand  ?Memory/Recall Ability Memory/Recall Ability : Current season;That he or she is in a hospital/hospital unit  ? ?Short Term Goals: ?Week 1: SLP Short Term Goal 1 (Week 1): STG=LTG due to ELOS ? ?Refer to Care Plan for Long Term Goals ? ?Recommendations for other services: Neuropsych and Surveyor, mining group and Outing/community reintegration ? ?Discharge Criteria: Patient will be discharged from SLP if patient refuses treatment 3 consecutive times without medical reason, if treatment goals not met, if there is a change in medical status, if patient makes no progress towards goals or if patient is discharged from hospital. ? ?The above assessment, treatment plan, treatment alternatives and goals were discussed and mutually agreed upon: by patient ? ?Nainoa Woldt T Ashish Rossetti ?05/09/2021, 2:58 PM ? ? ?

## 2021-05-09 NOTE — Evaluation (Signed)
Occupational Therapy Assessment and Plan ? ?Patient Details  ?Name: Peggy Vega ?MRN: [id number] ?Date of Birth: Mar 15, 1970 ? ?OT Diagnosis: abnormal posture, cognitive deficits, disturbance of vision, and muscle weakness (generalized) ?Rehab Potential:   ?ELOS: 7-10  ? ?Today's Date: 05/09/2021 ?OT Individual Time: [id number] ?OT Individual Time Calculation (min): 60 min    ? ?Hospital Problem: Principal Problem: ?  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 ? ?Past Medical History:  ?Past Medical History:  ?Diagnosis Date  ? Depression   ? ETOH abuse   ? Hyponatremia   ? Seizure ([place])   ? ?Past Surgical History: History reviewed. No pertinent surgical history. ? ?Assessment & Plan ?Clinical Impression 51 y.o. female presents to [facility] hospital s/p fall on 05/04/2021, + ETOH. Pt with seizure-like activity after fall. CT head demonstrates right frontal intracerebral hemorrhage and occipital skull fracture. PMH includes ETOH abuse, depression.  ? ? ?Patient currently requires min with basic self-care skills and IADL secondary to muscle weakness, decreased cardiorespiratoy endurance, impaired timing and sequencing, decreased visual perceptual skills, decreased attention, decreased awareness, decreased problem solving, decreased safety awareness, decreased memory, delayed processing, and demonstrates behaviors consistent with Rancho Level 7, and decreased standing balance, decreased postural control, and decreased balance strategies.  Prior to hospitalization, patient could complete BADL/IADL/Vocation with independent . ? ?Patient will benefit from skilled intervention to increase independence with basic self-care skills and increase level of independence with iADL prior to discharge home with care partner.  Anticipate patient will require 24 hour supervision and follow up outpatient. ? ?OT - End of Session ?Endurance Deficit: Yes ?Endurance Deficit Description: occasional rest breaks needed during  funcitonal mobility assessment ?OT Assessment ?OT Barriers to Discharge: Decreased caregiver support;Lack of/limited family support ?OT Patient demonstrates impairments in the following area(s): Balance;Cognition;Endurance;Motor;Pain;Perception;Safety;Sensory;Skin Integrity;Vision ?OT Basic ADL's Functional Problem(s): Grooming;Bathing;Dressing;Toileting ?OT Advanced ADL's Functional Problem(s): Simple Meal Preparation;Laundry;Light Housekeeping ?OT Transfers Functional Problem(s): Toilet;Tub/Shower ?OT Additional Impairment(s): None ?OT Plan ?OT Intensity: Minimum of 1-2 x/day, 45 to 90 minutes ?OT Frequency: 5 out of 7 days ?OT Duration/Estimated Length of Stay: 7-10 ?OT Treatment/Interventions: Balance/vestibular training;Discharge planning;Functional electrical stimulation;Pain management;Self Care/advanced ADL retraining;Therapeutic Activities;UE/LE Coordination activities;Visual/perceptual remediation/compensation;Therapeutic Exercise;Skin care/wound managment;Patient/family education;Functional mobility training;Disease mangement/prevention;Cognitive remediation/compensation;Community reintegration;DME/adaptive equipment instruction;Neuromuscular re-education;Psychosocial support;Splinting/orthotics;UE/LE Strength taining/ROM;Wheelchair propulsion/positioning ?OT Self Feeding Anticipated Outcome(s): MOD I ?OT Basic Self-Care Anticipated Outcome(s): MOD I ?OT Toileting Anticipated Outcome(s): MOD I ?OT Bathroom Transfers Anticipated Outcome(s): MOD I ?OT Recommendation ?Recommendations for Other Services: Neuropsych consult;Therapeutic Recreation consult ?Therapeutic Recreation Interventions: Pet therapy;Stress management;Outing/community reintergration;Kitchen group ?Patient destination: Home ?Follow Up Recommendations: 24 hour supervision/assistance;Outpatient OT ?Equipment Recommended: Tub/shower seat ? ? ?OT Evaluation ?Precautions/Restrictions  ?Precautions ?Precautions: Fall ?Restrictions ?Weight Bearing  Restrictions: No ?General ?  ?Vital Signs ?Therapy Vitals ?Temp: 98 ?F (36.7 ?C) ?Pulse Rate: 99 ?Resp: 17 ?BP: 117/69 ?Patient Position (if appropriate): Lying ?Oxygen Therapy ?SpO2: 99 % ?O2 Device: Room Air ?Pain ?Pain Assessment ?Pain Scale: 0-10 ?Pain Score: 5  ?Pain Location: Head ?Pain Orientation: Posterior ?Pain Descriptors / Indicators: Headache ?Pain Onset: On-going ?Home Living/Prior Functioning ?Home Living ?Family/patient expects to be discharged to:: Private residence ?Living Arrangements: Spouse/significant other, Children ?Available Help at Discharge: Family, Available 24 hours/day ?Type of Home: House ?Home Access: Stairs to enter ?Entrance Stairs-Number of Steps: 2 ?Entrance Stairs-Rails: None ?Home Layout: Two level, Bed/bath upstairs ?Alternate Level Stairs-Number of Steps: flight ?Alternate Level Stairs-Rails: Right ?Bathroom Shower/Tub: Walk-in shower ?Bathroom Toilet: Standard ?Bathroom Accessibility: Yes ?Additional Comments: no downstairs bedrooms; lives with husband  and 2 of her Peggy Vega, husband works, pt works from home ? Lives With: Spouse, Family ?IADL History ?Education: Bachelor's ?Prior Function ?Vocation: Full time employment ?Vision ?Baseline Vision/History: 1 Wears glasses ?Ability to See in Adequate Light: 0 Adequate ?Patient Visual Report: Blurring of vision ?Vision Assessment?: Vision impaired- to be further tested in functional context;Yes ?Eye Alignment: Within Functional Limits ?Ocular Range of Motion: Within Functional Limits ?Alignment/Gaze Preference: Within Defined Limits ?Tracking/Visual Pursuits: Decreased smoothness of horizontal tracking ?Saccades: Undershoots ?Convergence: Within functional limits ?Perception  ?Perception: Within Functional Limits ?Praxis ?Praxis: Intact ?Cognition ?Cognition ?Overall Cognitive Status: Impaired/Different from baseline ?Arousal/Alertness: Awake/alert ?Orientation Level: Person;Place;Situation ?Person: Oriented ?Place:  Oriented ?Situation: Oriented ?Memory: Impaired ?Memory Impairment: Storage deficit;Decreased short term memory ?Decreased Short Term Memory: Verbal basic ?Sustained Attention: Appears intact ?Selective Attention: Impaired ?Selective Attention Impairment: Verbal complex ?Awareness: Impaired ?Awareness Impairment: Emergent impairment;Anticipatory impairment ?Problem Solving: Impaired ?Problem Solving Impairment: Verbal complex ?Executive Function: Reasoning;Self Monitoring;Self Correcting ?Reasoning: Impaired ?Reasoning Impairment: Verbal complex;Functional complex ?Self Monitoring: Impaired ?Self Monitoring Impairment: Verbal complex;Functional complex ?Self Correcting: Impaired ?Self Correcting Impairment: Verbal complex;Functional complex ?Behaviors: Impulsive ?Safety/Judgment: Impaired ?Ranch[place] [employer] Scales of Cognitive Functioning: Automatic/appropriate ?Brief Interview for Mental Status (BIMS) ?Repetition of Three Words (First Attempt): 3 ?Temporal Orientation: Year: Correct ?Temporal Orientation: Month: Accurate within 5 days ?Temporal Orientation: Day: Correct ?Recall: "Sock": Yes, no cue required ?Recall: "Blue": Yes, no cue required ?Recall: "Bed": Yes, after cueing ("a piece of furniture") ?BIMS Summary Score: 14 ?Sensation ?Sensation ?Light Touch: Appears Intact ?Proprioception: Appears Intact ?Coordination ?Gross Motor Movements are Fluid and Coordinated: Yes ?Fine Motor Movements are Fluid and Coordinated: No ?Coordination and Movement Description: mild decrease LUE vs RUE ?Finger Nose Finger Test: mild dysmetria L vs R ?Heel Shin Test: mild dysmetira L vs R ?Motor  ?Motor ?Motor: Hemiplegia ?Motor - Skilled Clinical Observations: very mild deficits L  ?Trunk/Postural Assessment  ?Cervical Assessment ?Cervical Assessment: Exceptions to [facility] ?Thoracic Assessment ?Thoracic Assessment: Within Functional Limits ?Lumbar Assessment ?Lumbar Assessment: Within Functional Limits ?Postural Control ?Postural  Control: Deficits on evaluation ?Righting Reactions: delayed  ?Balance ?Balance ?Balance Assessed: Yes ?Standardized Balance Assessment ?Standardized Balance Assessment: Timed Up and Go Test ?Timed Up and Go Test ?

## 2021-05-09 NOTE — Progress Notes (Signed)
Patient ID: Peggy Vega, female   DOB: [birth year], 51 y.o.   MRN: [id number] ? ?SW made efforts to meet with pt but pt with physician. SW will continue to make efforts to complete, and coordinate pt discharge needs.  ? ?Sw spoke with tp husband Jeannett Senior to introduce self, explain role, and discuss discharge process. SW informed on ELOS. SW shared will f/u once all evaluations are completed. He is aware there will also be f/u after team conference on Tuesday.  ? ?Cecile Sheerer, MSW, LCSWA ?Office: [phone] ?Cell: [phone] ?Fax: [phone]  ?

## 2021-05-09 NOTE — Progress Notes (Signed)
Patient information reviewed and entered into eRehab System by Becky Deshante Cassell, PPS coordinator. Information including medical coding, function ability, and quality indicators will be reviewed and updated through discharge.   

## 2021-05-09 NOTE — Progress Notes (Addendum)
?                                                       PROGRESS NOTE ? ? ?Subjective/Complaints: Patient was seen at bedside today while working with OT while getting hair combed.  No major complaints,  Slept well last night.  ? ?Objective:  Reports mild myofascial neck pain, denies headache. ? ?  ?No results found. ?Recent Labs  ?  05/09/21 ?0631  ?WBC 8.7  ?HGB 13.7  ?HCT 39.2  ?PLT 285  ? ?Recent Labs  ?  05/09/21 ?0631  ?NA 132*  ?K 4.8  ?CL 97*  ?CO2 25  ?GLUCOSE 105*  ?BUN 12  ?CREATININE 0.68  ?CALCIUM 9.8  ? ? ?Intake/Output Summary (Last 24 hours) at 05/09/2021 1422 ?Last data filed at 05/09/2021 1258 ?Gross per 24 hour  ?Intake 117 ml  ?Output --  ?Net 117 ml  ?  ? ?  ? ?Physical Exam: ?Vital Signs ?Blood pressure 117/69, pulse 99, temperature 98 ?F (36.7 ?C), resp. rate 17, height 5\' 4"  (1.626 m), weight 72.7 kg, SpO2 99 %. ? ?Physical Exam ?Constitutional:   ?   Appearance: Normal appearance.  ?HENT:  ?   Head: Normocephalic.  ?   Nose: Nose normal.  ?   Mouth/Throat:  ?   Mouth: Mucous membranes are moist.  ?Eyes:  ?   Pupils: Pupils are equal, round, and reactive to light.  ?Cardiovascular:  ?   Rate and Rhythm: Normal rate and regular rhythm.  ?   Pulses: Normal pulses.  ?   Heart sounds: Normal heart sounds.  ?Pulmonary:  ?   Effort: Pulmonary effort is normal.  ?Abdominal:  ?   General: Abdomen is flat.  ?   Palpations: Abdomen is soft.  ?Musculoskeletal:     ?   General: Normal range of motion.  ?   Cervical back: Neck supple. Tenderness present.  ?Skin: ?   General: Skin is warm and dry.  ?Neurological:  ?   Mental Status: She is alert and oriented to person, place, and time.  ?Psychiatric:     ?   Mood and Affect: Mood normal.  ?  ? ? ?Assessment/Plan: ?1. Functional deficits which require 3+ hours per day of interdisciplinary therapy in a comprehensive inpatient rehab setting. ?Physiatrist is providing close team supervision and 24 hour management of active medical problems listed  below. ?Physiatrist and rehab team continue to assess barriers to discharge/monitor patient progress toward functional and medical goals ? ?Care Tool: ? ?Bathing ?   ?   ?   ?  ?  ?Bathing assist   ?  ?  ?Upper Body Dressing/Undressing ?Upper body dressing   ?What is the patient wearing?: Pull over shirt, Bra ?   ?Upper body assist Assist Level: Independent ?   ?Lower Body Dressing/Undressing ?Lower body dressing ? ? ?   ?What is the patient wearing?: Underwear/pull up ? ?  ? ?Lower body assist Assist for lower body dressing: Independent ?   ? ?Toileting ?Toileting    ?Toileting assist Assist for toileting: Contact Guard/Touching assist ?  ?  ?Transfers ?Chair/bed transfer ? ?Transfers assist ?   ? ?Chair/bed transfer assist level: Contact Guard/Touching assist ?  ?  ?Locomotion ?Ambulation ? ? ?Ambulation assist ? ?   ? ?Assist level:  Contact Guard/Touching assist ?Assistive device: No Device ?Max distance: 200  ? ?Walk 10 feet activity ? ? ?Assist ?   ? ?Assist level: Contact Guard/Touching assist ?Assistive device: No Device  ? ?Walk 50 feet activity ? ? ?Assist   ? ?Assist level: Contact Guard/Touching assist ?Assistive device: No Device  ? ? ?Walk 150 feet activity ? ? ?Assist   ? ?Assist level: Contact Guard/Touching assist ?Assistive device: No Device ?  ? ?Walk 10 feet on uneven surface  ?activity ? ? ?Assist   ? ? ?Assist level: Contact Guard/Touching assist ?   ? ?Wheelchair ? ? ? ? ?Assist Is the patient using a wheelchair?: No ?  ?  ? ?  ?   ? ? ?Wheelchair 50 feet with 2 turns activity ? ? ? ?Assist ? ?  ?  ? ? ?   ? ?Wheelchair 150 feet activity  ? ? ? ?Assist ?   ? ? ?   ? ?Blood pressure 117/69, pulse 99, temperature 98 ?F (36.7 ?C), resp. rate 17, height 5\' 4"  (1.626 m), weight 72.7 kg, SpO2 99 %. ? ?Medical Problem List and Plan: ?1. 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            -patient may shower and wash hair carefully- need  ot unmat hair so can look better at hematoma ?            -ELOS/Goals: 7-10 days- supervision to mod I ?            -3/31 hematoma visible with some dried blood but skin intact and healing ? -PT, OT, speech evaluation today. ?2.  Antithrombotics:-DVT/anticoagulation:  Mechanical: Sequential compression devices, below knee Bilateral lower extremities ?            -antiplatelet therapy: None ?3. Pain Management: Tylenol, hydrocodone, Robaxin as needed ?4. Mood: LCSW to evaluate and provide emotional support ?            -Continue Zoloft 100 mg daily ?            -antipsychotic agents: n/a ?5. Neuropsych: This patient is mostly capable of making decisions on her own behalf. But has decreased insight into deficits ?6. Skin/Wound Care: Routine skin care checks ?7. Fluids/Electrolytes/Nutrition: Routine I's and O's and follow-up chemistries ?8: Tonic-clonic seizure/TBI: Continue Keppra 1000 mg twice daily ?9: Alcohol abuse: Provide cessation counseling, associated risks ?10: Hypokalemia: KCl 40 mEq daily x 2 doses; resolved, continue weekly BMP ?11. Impaired balance-PT and OT for treatment  ?12. Constipation- not clear when had LBM.   ?3/31 Patient self-reports that it has been several days. Patient takes Trulance at home but doesn't have with her, which is likely why she currently has constipation. Will have patient bring from home.  Adjusted frequency of Miralax form once daily to BID. ?13. Headache/neck pain- myofascial most likely from whiplash, place order for lidocaine patch for shoulder pain.  Prior complaint of headache has has resolved.  Will continue to monitor. ?  ? ?LOS: ?1 days ?A FACE TO FACE EVALUATION WAS PERFORMED ? ?4/31, NP ?05/09/2021, 2:22 PM  ? ?  ?

## 2021-05-09 NOTE — Evaluation (Signed)
Physical Therapy Assessment and Plan ? ?Patient Details  ?Name: Peggy Vega ?MRN: [id number] ?Date of Birth: [birth year] ? ?PT Diagnosis: Abnormality of gait, Cognitive deficits, and Pain in head ?Rehab Potential: Good ?ELOS: 7days  ? ?Today's Date: 05/09/2021 ?PT Individual Time: 1100-1200 ?PT Individual Time Calculation (min): 60 min   ? ?Hospital Problem: Active Problems: ?  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 ? ?Past Medical History:  ?Past Medical History:  ?Diagnosis Date  ? Depression   ? ETOH abuse   ? Hyponatremia   ? Seizure (HCC)   ? ?Past Surgical History: History reviewed. No pertinent surgical history. ? ?Assessment & Plan ?Clinical Impression: Peggy Vega is a 51 year old female who presented to [facility] emergency department on 05/04/2021 after a fall.  She had reportedly had been drinking an estimate of 8 beers, was walking up a ramp to surface and she fell backwards hitting the back of her head.  She was noted to have mild shaking by her husband but had a pulse and was breathing.  Several minutes later he described another episode of shaking for about 10 to 15 minutes and she went stiff.  She bit her tongue.  No history of prior seizures.  She was admitted by neurosurgery and evaluated by Dr. Conchita Paris.  CT scan of the head demonstrated a right frontal intraparenchymal hematoma and nondisplaced linear fracture through the right occipital bone and overlying scalp hematoma.  Laboratory work-up revealed severe hyponatremia.  Keppra 500 mg started.  EEG obtained.  Hypertonic saline initiated.  Critical care medicine consulted for further assistance with seizure activity.  Mental status was greatly improved the following day.  Tube feeds via core track given.  Her hyponatremia was corrected and she was ready to transfer to stepdown on 3/29.  She was tolerating a regular diet.The patient requires inpatient physical medicine and rehabilitation evaluations and treatment  secondary to dysfunction due to traumatic brain injury, intraparenchymal hemorrhage, skull fracture, tonic clonic seizure. ? Patient transferred to CIR on 05/08/2021 .  ? ?Patient currently requires min with mobility secondary to muscle weakness, impaired timing and sequencing and decreased coordination, and decreased awareness, decreased problem solving, decreased safety awareness, and delayed processing.  Prior to hospitalization, patient was min with mobility and lived with Spouse, Family in a House home.  Home access is 2Stairs to enter. ? ?Patient will benefit from skilled PT intervention to maximize safe functional mobility, minimize fall risk, and decrease caregiver burden for planned discharge home with intermittent assist.  Anticipate patient will  TBD  at discharge. ? ?PT - End of Session ?Activity Tolerance: Tolerates 30+ min activity with multiple rests ?Endurance Deficit: Yes ?Endurance Deficit Description: occasional rest breaks needed during funcitonal mobility assessment ?PT Assessment ?Rehab Potential (ACUTE/IP ONLY): Good ?PT Barriers to Discharge: Inaccessible home environment;Decreased caregiver support ?PT Barriers to Discharge Comments: assist not available mon-thrus ?PT Patient demonstrates impairments in the following area(s): Balance;Motor;Pain;Safety ?PT Transfers Functional Problem(s): Bed Mobility;Bed to Chair;Car;Furniture;Floor ?PT Locomotion Functional Problem(s): Ambulation;Stairs ?PT Plan ?PT Intensity: Minimum of 1-2 x/day ,45 to 90 minutes ?PT Frequency: 5 out of 7 days ?PT Duration Estimated Length of Stay: 7days ?PT Treatment/Interventions: Ambulation/gait training;Community reintegration;DME/adaptive equipment instruction;Neuromuscular re-education;Psychosocial support;Stair training;Balance/vestibular training;Discharge planning;Pain management;Therapeutic Activities;UE/LE Coordination activities;Cognitive remediation/compensation;Disease management/prevention;Functional  mobility training;Patient/family education;Therapeutic Exercise ?PT Transfers Anticipated Outcome(s): independent ?PT Locomotion Anticipated Outcome(s): supervision ?PT Recommendation ?Follow Up Recommendations: Outpatient PT ?Patient destination: Home ?Equipment Recommended: To be determined ? ? ?PT Evaluation ?Precautions/Restrictions ?Precautions ?Precautions:  Fall ?Restrictions ?Weight Bearing Restrictions: No ? ?Pain ?Pain Assessment ?Pain Scale: 0-10 ?Pain Score: 5  ?Pain Location: Head ?Pain Orientation: Posterior ?Pain Descriptors / Indicators: Headache ?Pain Onset: On-going ?Pain Intervention(s): Medication (See eMAR) ?Pain Interference ?Pain Interference ?Pain Effect on Sleep: 3. Frequently ?Pain Interference with Therapy Activities: 1. Rarely or not at all ?Pain Interference with Day-to-Day Activities: 1. Rarely or not at all ?Home Living/Prior Functioning ?Home Living ?Available Help at Discharge: Family;Available 24 hours/day ?Type of Home: House ?Home Access: Stairs to enter ?Entrance Stairs-Number of Steps: 2 ?Entrance Stairs-Rails: None ?Home Layout: Two level;Bed/bath upstairs ?Alternate Level Stairs-Number of Steps: flight ?Alternate Level Stairs-Rails: Right ?Bathroom Shower/Tub: Walk-in shower ?Bathroom Toilet: Standard ?Bathroom Accessibility: Yes ?Additional Comments: no downstairs bedrooms; lives with husband and 2 of her [name], husband works, pt works from home ? Lives With: Spouse;Family ?Vision/Perception  ?Vision - History ?Ability to See in Adequate Light: 0 Adequate  ?Cognition ?Overall Cognitive Status: Impaired/Different from baseline ?Arousal/Alertness: Awake/alert ?Orientation Level: Oriented X4 ?Year: 2023 ?Month: March ?Day of Week: Correct ?Sustained Attention: Appears intact ?Memory: Impaired ?Memory Impairment: Storage deficit;Decreased short term memory ?Sensation ?Sensation ?Light Touch: Appears Intact ?Proprioception: Appears Intact ?Coordination ?Gross Motor Movements  are Fluid and Coordinated: Yes ?Fine Motor Movements are Fluid and Coordinated: No ?Coordination and Movement Description: mild decrease LUE vs RUE ?Finger Nose Finger Test: mild dysmetria L vs R ?Heel Shin Test: mild dysmetira L vs R ?Motor  ?Motor ?Motor: Hemiplegia ?Motor - Skilled Clinical Observations: very mild deficits L  ? ?Trunk/Postural Assessment  ?Cervical Assessment ?Cervical Assessment: Exceptions to [facility] ?Thoracic Assessment ?Thoracic Assessment: Within Functional Limits ?Lumbar Assessment ?Lumbar Assessment: Within Functional Limits ?Postural Control ?Postural Control: Deficits on evaluation ?Righting Reactions: delayed  ?Balance ?Balance ?Balance Assessed: Yes ?Standardized Balance Assessment ?Standardized Balance Assessment: Timed Up and Go Test ?Timed Up and Go Test ?TUG: Normal TUG ?Normal TUG (seconds): 17.38 ?Dynamic Sitting Balance ?Dynamic Sitting - Balance Support: Feet supported ?Dynamic Sitting - Level of Assistance: 7: Independent ?Dynamic Sitting - Balance Activities: Lateral lean/weight shifting;Forward lean/weight shifting;Reaching across midline ?Static Standing Balance ?Static Standing - Balance Support: No upper extremity supported ?Static Standing - Level of Assistance: 5: Stand by assistance ?Dynamic Standing Balance ?Dynamic Standing - Balance Support: During functional activity ?Dynamic Standing - Level of Assistance: 5: Stand by assistance;4: Min assist ?Dynamic Standing - Balance Activities: Lateral lean/weight shifting;Forward lean/weight shifting;Reaching across midline ?Dynamic Standing - Comments: movement is slowed overall ?Extremity Assessment  ?RUE Assessment ?RUE Assessment: Within Functional Limits ?LUE Assessment ?LUE Assessment: Within Functional Limits ?RLE Assessment ?RLE Assessment: Within Functional Limits ?LLE Assessment ?LLE Assessment: Within Functional Limits ? ?Care Tool ?Care Tool Bed Mobility ?Roll left and right activity   ?Roll left and right assist  level: Supervision/Verbal cueing ?   ?Sit to lying activity   ?Sit to lying assist level: Supervision/Verbal cueing ?   ?Lying to sitting on side of bed activity   ?Lying to sitting on side of bed assist level: the abi

## 2021-05-09 NOTE — Plan of Care (Signed)
?  Problem: RH Problem Solving ?Goal: LTG Patient will demonstrate problem solving for (SLP) ?Description: LTG:  Patient will demonstrate problem solving for basic/complex daily situations with cues  (SLP) ?Flowsheets (Taken 05/09/2021 1501) ?LTG: Patient will demonstrate problem solving for (SLP): Complex daily situations ?LTG Patient will demonstrate problem solving for: Supervision ?  ?Problem: RH Memory ?Goal: LTG Patient will demonstrate ability for day to day (SLP) ?Description: LTG:   Patient will demonstrate ability for day to day recall/carryover during cognitive/linguistic activities with assist  (SLP) ?Flowsheets (Taken 05/09/2021 1501) ?LTG: Patient will demonstrate ability for day to day recall/carryover during cognitive/linguistic activities with assist (SLP): Supervision ?Goal: LTG Patient will use memory compensatory aids to (SLP) ?Description: LTG:  Patient will use memory compensatory aids to recall biographical/new, daily complex information with cues (SLP) ?Flowsheets (Taken 05/09/2021 1501) ?LTG: Patient will use memory compensatory aids to (SLP): Modified Independent ?  ?Problem: RH Attention ?Goal: LTG Patient will demonstrate this level of attention during functional activites (SLP) ?Description: LTG:  Patient will will demonstrate this level of attention during functional activites (SLP) ?Flowsheets (Taken 05/09/2021 1501) ?Patient will demonstrate during cognitive/linguistic activities the attention type of: Selective ?Patient will demonstrate this level of attention during cognitive/linguistic activities in: Controlled ?LTG: Patient will demonstrate this level of attention during cognitive/linguistic activities with assistance of (SLP): Supervision ?  ?Problem: RH Awareness ?Goal: LTG: Patient will demonstrate awareness during functional activites type of (SLP) ?Description: LTG: Patient will demonstrate awareness during functional activites type of (SLP) ?Flowsheets (Taken 05/09/2021  1501) ?Patient will demonstrate during cognitive/linguistic activities awareness type of: Anticipatory ?LTG: Patient will demonstrate awareness during cognitive/linguistic activities with assistance of (SLP): Supervision ?  ?

## 2021-05-10 DIAGNOSIS — S06340S Traumatic hemorrhage of right cerebrum without loss of consciousness, sequela: Secondary | ICD-10-CM

## 2021-05-10 NOTE — Progress Notes (Signed)
?                                                       PROGRESS NOTE ? ? ?Subjective/Complaints:  ?Watching TV with husband , no therapy schedule today  ?Objective:  Reports mild myofascial neck pain, denies headache. ? ?  ?No results found. ?Recent Labs  ?  05/09/21 ?0631  ?WBC 8.7  ?HGB 13.7  ?HCT 39.2  ?PLT 285  ? ? ?Recent Labs  ?  05/09/21 ?0631  ?NA 132*  ?K 4.8  ?CL 97*  ?CO2 25  ?GLUCOSE 105*  ?BUN 12  ?CREATININE 0.68  ?CALCIUM 9.8  ? ? ? ?Intake/Output Summary (Last 24 hours) at 05/10/2021 1128 ?Last data filed at 05/10/2021 0700 ?Gross per 24 hour  ?Intake 337 ml  ?Output --  ?Net 337 ml  ? ?  ? ?  ? ?Physical Exam: ?Vital Signs ?Blood pressure 114/72, pulse 75, temperature 98.1 ?F (36.7 ?C), temperature source Oral, resp. rate 18, height 5\' 4"  (1.626 m), weight 72.7 kg, SpO2 98 %. ? ?Physical Exam ?Constitutional:   ?   Appearance: Normal appearance.  ?HENT:  ?   Head: Normocephalic.  ?   Nose: Nose normal.  ?   Mouth/Throat:  ?   Mouth: Mucous membranes are moist.  ?Eyes:  ?   Pupils: Pupils are equal, round, and reactive to light.  ?Cardiovascular:  ?   Rate and Rhythm: Normal rate and regular rhythm.  ?   Pulses: Normal pulses.  ?   Heart sounds: Normal heart sounds.  ?Pulmonary:  ?   Effort: Pulmonary effort is normal.  ?Abdominal:  ?   General: Abdomen is flat.  ?   Palpations: Abdomen is soft.  ?Musculoskeletal:     ?   General: Normal range of motion.  ?   Cervical back: Neck supple. Tenderness present.  ?Skin: ?   General: Skin is warm and dry.  ?Neurological:  ?   Mental Status: She is alert and oriented to person, place, and time.  ?Psychiatric:     ?   Mood and Affect: Mood normal.  ?  ?Unchanged 4/1 ? ?Assessment/Plan: ?1. Functional deficits which require 3+ hours per day of interdisciplinary therapy in a comprehensive inpatient rehab setting. ?Physiatrist is providing close team supervision and 24 hour management of active medical problems listed below. ?Physiatrist and rehab team continue to  assess barriers to discharge/monitor patient progress toward functional and medical goals ? ?Care Tool: ? ?Bathing ?   ?Body parts bathed by patient: Right arm, Left arm, Chest, Abdomen, Front perineal area, Buttocks, Right upper leg, Left upper leg, Right lower leg, Left lower leg, Face  ?   ?  ?  ?Bathing assist Assist Level: Contact Guard/Touching assist ?  ?  ?Upper Body Dressing/Undressing ?Upper body dressing   ?What is the patient wearing?: Pull over shirt ?   ?Upper body assist Assist Level: Contact Guard/Touching assist ?   ?Lower Body Dressing/Undressing ?Lower body dressing ? ? ?   ?What is the patient wearing?: Underwear/pull up ? ?  ? ?Lower body assist Assist for lower body dressing: Contact Guard/Touching assist ?   ? ?Toileting ?Toileting    ?Toileting assist Assist for toileting: Contact Guard/Touching assist ?  ?  ?Transfers ?Chair/bed transfer ? ?Transfers assist ?   ? ?  Chair/bed transfer assist level: Contact Guard/Touching assist ?  ?  ?Locomotion ?Ambulation ? ? ?Ambulation assist ? ?   ? ?Assist level: Contact Guard/Touching assist ?Assistive device: No Device ?Max distance: 200  ? ?Walk 10 feet activity ? ? ?Assist ?   ? ?Assist level: Contact Guard/Touching assist ?Assistive device: No Device  ? ?Walk 50 feet activity ? ? ?Assist   ? ?Assist level: Contact Guard/Touching assist ?Assistive device: No Device  ? ? ?Walk 150 feet activity ? ? ?Assist   ? ?Assist level: Contact Guard/Touching assist ?Assistive device: No Device ?  ? ?Walk 10 feet on uneven surface  ?activity ? ? ?Assist   ? ? ?Assist level: Contact Guard/Touching assist ?   ? ?Wheelchair ? ? ? ? ?Assist Is the patient using a wheelchair?: No ?  ?  ? ?  ?   ? ? ?Wheelchair 50 feet with 2 turns activity ? ? ? ?Assist ? ?  ?  ? ? ?   ? ?Wheelchair 150 feet activity  ? ? ? ?Assist ?   ? ? ?   ? ?Blood pressure 114/72, pulse 75, temperature 98.1 ?F (36.7 ?C), temperature source Oral, resp. rate 18, height 5\' 4"  (1.626 m), weight 72.7  kg, SpO2 98 %. ? ?Medical Problem List and Plan: ?1. 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            -patient may shower and wash hair carefully- need ot unmat hair so can look better at hematoma ?            -ELOS/Goals: 7-10 days- supervision to mod I discussed team conf date is tues ?            -3/31 hematoma visible with some dried blood but skin intact and healing ? -PT, OT, speech evaluation today. ?2.  Antithrombotics:-DVT/anticoagulation:  Mechanical: Sequential compression devices, below knee Bilateral lower extremities ?            -antiplatelet therapy: None ?3. Pain Management: Tylenol, hydrocodone, Robaxin as needed ?4. Mood: LCSW to evaluate and provide emotional support ?            -Continue Zoloft 100 mg daily ?            -antipsychotic agents: n/a ?5. Neuropsych: This patient is mostly capable of making decisions on her own behalf. But has decreased insight into deficits ?6. Skin/Wound Care: Routine skin care checks ?7. Fluids/Electrolytes/Nutrition: Routine I's and O's and follow-up chemistries ?8: Tonic-clonic seizure/TBI: Continue Keppra 1000 mg twice daily ?9: Alcohol abuse: Provide cessation counseling, associated risks ?10: Hypokalemia: KCl 40 mEq daily x 2 doses; resolved, continue weekly BMP ?11. Impaired balance-PT and OT for treatment  ?12. Constipation- not clear when had LBM.   ?3/31 Patient self-reports that it has been several days. Patient takes Trulance at home but doesn't have with her, which is likely why she currently has constipation. Will have patient bring from home.  Adjusted frequency of Miralax form once daily to BID. ?13. Headache/neck pain- myofascial most likely from whiplash, place order for lidocaine patch for shoulder pain.  Prior complaint of headache has has resolved.  Will continue to monitor. ?  ?May have therapeutic grounds pass with husband , needs WC ?LOS: ?2 days ?A FACE TO FACE EVALUATION  WAS PERFORMED ? ?4/31, NP ?05/10/2021, 11:28 AM  ? ?  ?

## 2021-05-10 NOTE — Progress Notes (Signed)
Occupational Therapy TBI Note ? ?Patient Details  ?Name: Peggy Vega ?MRN: [id number] ?Date of Birth: [birth year] ? ?Today's Date: 05/10/2021 ?OT Individual Time: 8209-9068 ?OT Individual Time Calculation (min): 55 min  ? ? ?Short Term Goals: ?Week 1:  OT Short Term Goal 1 (Week 1): STG=LTG d/t ELOS ? ?Skilled Therapeutic Interventions/Progress Updates:  ?  Pt received supine with no c/o pain, agreeable to OT session. Provided TBI education throughout session to pt and husband who arrived halfway through session. She completed 100 ft of functional mobility to the therapy gym with CGA overall. Worked on higher level dynamic balance demands and dual processing tasks on the Litegait. Harness on but not taking any weight. Pt completed dynamic stepping tasks over dynamic targets and then on sequencing and route memorization tasks while walking without UE support for 1400 ft at 1.8 mph. Pt completed several more activities off the litegait with a dynamic balance/coordination component and a cognitive naming task- she required mod cueing to continue physical component when processing cognitively. She returned to her room and was left sitting up with all needs met, bed alarm set. Husband present.  ? ? ?Therapy Documentation ?Precautions:  ?Precautions ?Precautions: Fall ?Restrictions ?Weight Bearing Restrictions: No ? ?Agitated Behavior Scale: ?TBI ?Observation Details ?Observation Environment: 4W ?Start of observation period - Date: 05/10/21 ?Start of observation period - Time: 1340 ?End of observation period - Date: 05/10/21 ?End of observation period - Time: 1530 ?Agitated Behavior Scale (DO NOT LEAVE BLANKS) ?Short attention span, easy distractibility, inability to concentrate: Absent ?Impulsive, impatient, low tolerance for pain or frustration: Absent ?Uncooperative, resistant to care, demanding: Absent ?Violent and/or threatening violence toward people or property: Absent ?Explosive and/or unpredictable anger:  Absent ?Rocking, rubbing, moaning, or other self-stimulating behavior: Absent ?Pulling at tubes, restraints, etc.: Absent ?Wandering from treatment areas: Absent ?Restlessness, pacing, excessive movement: Absent ?Repetitive behaviors, motor, and/or verbal: Absent ?Rapid, loud, or excessive talking: Absent ?Sudden changes of mood: Absent ?Easily initiated or excessive crying and/or laughter: Absent ?Self-abusiveness, physical and/or verbal: Absent ?Agitated behavior scale total score: 14 ? ? ?Therapy/Group: Individual Therapy ? ?Curtis Sites ?05/10/2021, 2:44 PM ?

## 2021-05-11 MED ORDER — SERTRALINE HCL 100 MG PO TABS
200.0000 mg | ORAL_TABLET | Freq: Every day | ORAL | Status: DC
  Administered 2021-05-12 – 2021-05-14 (×3): 200 mg via ORAL
  Filled 2021-05-11 (×3): qty 2

## 2021-05-11 MED ORDER — CYANOCOBALAMIN 1000 MCG/ML IJ SOLN
1000.0000 ug | Freq: Once | INTRAMUSCULAR | Status: AC
  Administered 2021-05-11: 1000 ug via INTRAMUSCULAR
  Filled 2021-05-11: qty 1

## 2021-05-11 MED ORDER — SERTRALINE HCL 100 MG PO TABS
100.0000 mg | ORAL_TABLET | Freq: Once | ORAL | Status: AC
  Administered 2021-05-11: 100 mg via ORAL
  Filled 2021-05-11: qty 1

## 2021-05-11 NOTE — Progress Notes (Signed)
Speech Language Pathology TBI Note ? ?Patient Details  ?Name: Peggy Vega ?MRN: [id number] ?Date of Birth: 05-Oct-1970 ? ?Today's Date: 05/11/2021 ?SLP Individual Time: 7564-3329 ?SLP Individual Time Calculation (min): 25 min ? ?Short Term Goals: ?Week 1: SLP Short Term Goal 1 (Week 1): STG=LTG due to ELOS ? ?Skilled Therapeutic Interventions: ? Pt was seen for skilled ST targeting cognitive goals.  Upon arrival pt complained of 5/10 headache but was agreeable to participating in treatment.  RN was made aware of pt's request for pain meds.  SLP facilitated the session with medication management tasks targeting higher level problem solving and memory goals.  Pt was able to recall function and frequency of all but one of her currently scheduled medications when named independently.  Pt then loaded medications into a twice daily pill box with x1 supervision verbal cue for use of organizational strategies to minimize risk for error.  Pt did complete task quickly but without errors.  She reports that she uses a pill box at home prior to admission and plans to continue to do so once discharged from CIR.  Pt requested that she be allowed to take a shower with her husband's help as therapist was leaving the room.  SLP reviewed and reinforced the unit's safety policies and procedures and asked that pt have staff assistance for showers until husband is signed off by OT to assist.  Pt verbalized understanding and was in agreement.  Pt was left in bed with bed alarm set and call bell within reach.  Continue per current plan of care.   ? ?Pain ?Pain Assessment ?Pain Scale: 0-10 ?Pain Score: 5  ?Pain Location: Head ?Pain Descriptors / Indicators: Headache ?Pain Intervention(s): RN made aware ? ?Agitated Behavior Scale: ?TBI ?Observation Details ?Observation Environment: pt's room ?Start of observation period - Date: 05/11/21 ?Start of observation period - Time: 0905 ?End of observation period - Date: 05/11/21 ?End of  observation period - Time: 0930 ?Agitated Behavior Scale (DO NOT LEAVE BLANKS) ?Short attention span, easy distractibility, inability to concentrate: Absent ?Impulsive, impatient, low tolerance for pain or frustration: Absent ?Uncooperative, resistant to care, demanding: Absent ?Violent and/or threatening violence toward people or property: Absent ?Explosive and/or unpredictable anger: Absent ?Rocking, rubbing, moaning, or other self-stimulating behavior: Absent ?Pulling at tubes, restraints, etc.: Absent ?Wandering from treatment areas: Absent ?Restlessness, pacing, excessive movement: Absent ?Repetitive behaviors, motor, and/or verbal: Absent ?Rapid, loud, or excessive talking: Absent ?Sudden changes of mood: Absent ?Easily initiated or excessive crying and/or laughter: Absent ?Self-abusiveness, physical and/or verbal: Absent ?Agitated behavior scale total score: 14 ? ?Therapy/Group: Individual Therapy ? ?Chermaine Schnyder, Melanee Spry ?05/11/2021, 12:50 PM ?

## 2021-05-11 NOTE — Progress Notes (Signed)
Occupational Therapy TBI Note ? ?Patient Details  ?Name: Peggy Vega ?MRN: [id number] ?Date of Birth: [birth year] ? ?Today's Date: 05/11/2021 ?OT Individual Time: 0700-0800 ?OT Individual Time Calculation (min): 60 min  ? ? ?Short Term Goals: ?Week 1:  OT Short Term Goal 1 (Week 1): STG=LTG d/t ELOS ? ?Skilled Therapeutic Interventions/Progress Updates:  ?  Pt received in bed with mild HA from hematoma- increasing with movement/"more aware of it" throughout session. Rest provided PRN as well as alerted RN to deliver meds at end of session. Discussed throughout session TBI resources- [employer] (pt states, " dont tell my husband about all this--hes very focused on this TBI." Throughout session provided education on balance challenges, slowed processing, temperature regulation (pt likes to do hot yoga- told to not do this initially), decreased reaction time. Pt able to come up with an excuse as to why she this would be impaired prior to the TBI or why she doesn't need it (I.e not taking pre menopause meds, only having 6 stop lights in their town so she doesn't need good reaction time). Pt is able to name 2 challenges since the TBI 1: mental calculations are more difficult 2: walking/balance is "a little off" ? ?Session Outline/Activities which pt could recall all without writing down ? ?Scramble 2 eggs w/ cheese +toast w/ butter ?- Pt able to locate ADL apartment with no cuing after told "look for the room with the regular bed. Pt gathers items and demo decreased organization throughout task forgetting to select cheese as it is in drawer/not as obvious. Pt sees cheese after plating cooked eggs and states, "Oh yeah" and sprinkles cheese onto eggs with spoon she dropped on the floor (wipes off on pants). Overall use of stove top cooking was safe. Pt appropriately turned stove on/off without cuing. Pt stated "well if I make scrambled eggs I crack it into a bowl and use a fork." OT direct pt to use labels on  cabinets to locate needed items however pt cracks  egg into pan and uses fork to scramble in pan. Pt places bread in toaster but requires cuing to problem solve that toaster was not plugged in so the bread wouldn't stay down and toast after switching slots 3x. Pt able to appropriately restore the kitchen space even unplugging the toaster stating "I guess I will unplug this and make it hard for the next person too!" When picking up spilled cheese off the floor, turning to gather forgotten items or turning head to look while walking pt has min-mod LOB laterally crossing feet but able to take reactive steps to recover with supervision/no physical A ? ?BITS Assessment ?- pt completes 4 part motor/reaction timing assessment with high correlation to return to driving ?- Pt verbalized understanding that MD must clear pt to drive ?- pt initially misses first level and given second trial to complete which she passes all 4 subsequent trials. Pt nearly gives up on last level (4 min of visual scanning/divided attention to conversation pt initiates during task- stating "im over this, my arms are tired") demoing decreased attention to task and low frustration tolerance. Pt states, "if my husband takes my keys, Ill just ride my bike." Discussed other transportation methods that my be safer (using ride share, having her teens, husband or friends drive her places) until able to drive on her own.  ? ?Look up http://www.n[employer].com/, using item number provided on paper write down pricing and add up cost: adjustable C clamp, tablet  holder, Phone holder, 3/4 inch lock line ?- pt reads "https://clark.net/" instead of HOSE and types 2x  incorrectly not noticing error with first cue to look back at website title. Pt then able to notice it a second time. Slower/deliberate typing noted, but pt able to refer to item numbers and appropriately search prices and record on sheet. Pt then tallies on paper and writes herself a note to email this clinician  and other OT seeing her tomorrow after 5pm.  ? ?Homework: after 5pm email me a summary of what you did in therapy today (recalled with question cue) ? ?Overall pt continues to demo decreased awareness/insight into deficits and believes this is something she will "get over" once she is home. Exited session with pt seated in bed, exit alarm on and call light in reach ? ? ?Therapy Documentation ?Precautions:  ?Precautions ?Precautions: Fall ?Restrictions ?Weight Bearing Restrictions: No ?General: ?  ?Vital Signs: ? ?Agitated Behavior Scale: ?TBI  ?Observation Details ?Observation Environment: CIR ?Start of observation period - Date: 05/11/21 ?Start of observation period - Time: 0700 ?End of observation period - Date: 05/11/21 ?End of observation period - Time: 0800 ?Agitated Behavior Scale (DO NOT LEAVE BLANKS) ?Short attention span, easy distractibility, inability to concentrate: Absent ?Impulsive, impatient, low tolerance for pain or frustration: Absent ?Uncooperative, resistant to care, demanding: Absent ?Violent and/or threatening violence toward people or property: Absent ?Explosive and/or unpredictable anger: Absent ?Rocking, rubbing, moaning, or other self-stimulating behavior: Absent ?Pulling at tubes, restraints, etc.: Absent ?Wandering from treatment areas: Absent ?Restlessness, pacing, excessive movement: Absent ?Repetitive behaviors, motor, and/or verbal: Absent ?Rapid, loud, or excessive talking: Absent ?Sudden changes of mood: Absent ?Easily initiated or excessive crying and/or laughter: Absent ?Self-abusiveness, physical and/or verbal: Absent ?Agitated behavior scale total score: 14 ? ? ? ? ?Therapy/Group: Individual Therapy ? ?Elenore Paddy Galina Haddox ?05/11/2021, 12:53 PM ?

## 2021-05-11 NOTE — Progress Notes (Addendum)
Physical Therapy TBI Note ? ?Patient Details  ?Name: Peggy Vega ?MRN: [id number] ?Date of Birth: Dec 23, 1970 ? ?Today's Date: 05/11/2021 ?PT Individual Time: 1510-1540 ?PT Individual Time Calculation (min): 30 min  ? ?Short Term Goals: ?Week 1:  PT Short Term Goal 1 (Week 1): STG=LTGs ? ?Skilled Therapeutic Interventions/Progress Updates:  ?   ?Session 1: ?Patient in bed with her husband at bedside upon PT arrival. Patient alert and agreeable to PT session. Patient reported 5/10 headache pain during session, RN made aware. PT provided repositioning, rest breaks, and distraction as pain interventions throughout session.  ? ?Per OT, patient's husband asking to be cleared for transfers in the room and showering patient. Patient demonstrated ambulatory transfer to/from bathroom, toileting and donning shoes with supervision from her husband with safe guarding. Educated on wearing tennis shoes or non-skid socks with in room mobility. Educated on patient showering in sitting with set-up assist and supervision for safety, per [place], [place]. Patient and her husband in agreement. Patient's husband cleared for transfers, ambulation, and showering patient in the room at this time. Safety plan updated and nursing made aware.  ? ?Focused session on outcome measure assessment and TBI education. Patient with reduced frustration tolerance with errors and her husband during session. Patient with poor incite into her deficits at this time, planning on returning home and starting back at work and school immediately to "get caught up." Patient educated on impact of cognitive deficits, fatigue, and overstimulation on performance and patient's recovery. Recommended patient take time for recovery before returning to work and school activities, not well received by patient.  ? ?Therapeutic Activity: ?Bed Mobility: Patient performed supine to/from sit independently in a flat bed without use of bed rails.  ?Transfers: Patient performed  sit to/from stand with close-distant supervision throughout session without an AD.  ? ?Gait Training:  ?Patient ambulated >100 feet and >150 feet x2 with supervision. Ambulated with increased BOS, increased lateral trunk sway, decreased gait speed, decreased arm swing and trunk rotation, and decreased R foot clearance with catching x3, no additional assist to recover necessary. Provided verbal cues for reduced BOS, increased gait speed and arm swing for improved balance, and increased L weight shift for improved foot clearance. ?Patient ascended/descended 12 steps without rails with supervision for safety. Performed reciprocal gait pattern throughout.  ?6 Min Walk Test:  ?Instructed patient to ambulate as quickly and as safely as possible for 6 minutes using LRAD. Patient was allowed to take standing rest breaks without stopping the test, but if the patient required a sitting rest break the clock would be stopped and the test would be over.  ?Results: 1260 feet (384 meters, Avg speed 1.1 m/s) without an AD with supervision. Results indicate that the patient has reduced endurance with ambulation compared to age matched norms.  ?Age Matched Norms: <73 yo F: 538 meters ?MDC: 58.21 meters (190.98 feet) or 50 meters ?(ANPTA Core Set of Outcome Measures for Adults with Neurologic Conditions, 2018) ? ?Neuromuscular Re-ed: ?Functional Gait  Assessment ?Gait Level Surface: Walks 20 ft in less than 5.5 sec, no assistive devices, good speed, no evidence for imbalance, normal gait pattern, deviates no more than 6 in outside of the 12 in walkway width. ?Change in Gait Speed: Able to smoothly change walking speed without loss of balance or gait deviation. Deviate no more than 6 in outside of the 12 in walkway width. ?Gait with Horizontal Head Turns: Performs head turns smoothly with slight change in gait velocity (eg, minor disruption  to smooth gait path), deviates 6-10 in outside 12 in walkway width, or uses an assistive  device. ?Gait with Vertical Head Turns: Performs task with slight change in gait velocity (eg, minor disruption to smooth gait path), deviates 6 - 10 in outside 12 in walkway width or uses assistive device ?Gait and Pivot Turn: Turns slowly, requires verbal cueing, or requires several small steps to catch balance following turn and stop ?Step Over Obstacle: Is able to step over 2 stacked shoe boxes taped together (9 in total height) without changing gait speed. No evidence of imbalance. ?Gait with Narrow Base of Support: Ambulates 4-7 steps. ?Gait with Eyes Closed: Walks 20 ft, uses assistive device, slower speed, mild gait deviations, deviates 6-10 in outside 12 in walkway width. Ambulates 20 ft in less than 9 sec but greater than 7 sec. ?Ambulating Backwards: Walks 20 ft, uses assistive device, slower speed, mild gait deviations, deviates 6-10 in outside 12 in walkway width. ?Steps: Alternating feet, no rail. ?Total Score: 22/30 ?Patient demonstrates increased fall risk as noted by score of 22/30 on  Functional Gait Assessment.   ?<22/30 = predictive of falls, <20/30 = fall in 6 months, <18/30 = predictive of falls in PD ?MCID: 5 points stroke population, 4 points geriatric population ?(ANPTA Core Set of Outcome Measures for Adults with Neurologic Conditions, 2018) ?Berg Balance Test ?Sit to Stand: Able to stand without using hands and stabilize independently ?Standing Unsupported: Able to stand safely 2 minutes ?Sitting with Back Unsupported but Feet Supported on Floor or Stool: Able to sit safely and securely 2 minutes ?Stand to Sit: Sits safely with minimal use of hands ?Transfers: Able to transfer safely, minor use of hands ?Standing Unsupported with Eyes Closed: Able to stand 10 seconds safely ?Standing Ubsupported with Feet Together: Able to place feet together independently and stand 1 minute safely ?From Standing, Reach Forward with Outstretched Arm: Can reach confidently >25 cm (10") ?From Standing  Position, Pick up Object from Floor: Able to pick up shoe safely and easily ?From Standing Position, Turn to Look Behind Over each Shoulder: Looks behind from both sides and weight shifts well ?Turn 360 Degrees: Able to turn 360 degrees safely in 4 seconds or less ?Standing Unsupported, Alternately Place Feet on Step/Stool: Able to stand independently and safely and complete 8 steps in 20 seconds ?Standing Unsupported, One Foot in Front: Able to plae foot ahead of the other independently and hold 30 seconds (pt reports she has hx of L knee surgery) ?Standing on One Leg: Able to lift leg independently and hold > 10 seconds ?Total Score: 55/56 ?Patient demonstrated increased fall risk noted by score of 22/56 on the [facility] Scale.  ?<45/56 = fall risk, <42/56 = predictive of recurrent falls, <40/56 = 100% fall risk  ?>41 = independent, 21-40 = assistive device, 0-20 = wheelchair level  ?MDC 6.9 (4 pts 45-56, 5 pts 35-44, 7 pts 25-34) ?(ANPTA Core Set of Outcome Measures for Adults with Neurologic Conditions, 2018) ?Five times Sit to Stand Test (FTSS) ?Method: ?Use a straight back chair with a solid seat that is 17-18? high. Ask participant to sit on the chair with arms folded across their chest.   ?Instructions: ??Stand up and sit down as quickly as possible 5 times, keeping your arms folded across your chest.?   ?Measurement: ?Stop timing when the participant touches the chair in sitting the 5th time. ? ?TIME: 12.3 sec ? ?Cut off scores indicative of increased fall risk: >12 sec CVA, >  16 sec PD, >13 sec vestibular ?(ANPTA Core Set of Outcome Measures for Adults with Neurologic Conditions, 2018) ? ?Educated patient on results and interpretation, areas of weakness, and POC to address present deficits. Patient receptive to education. ? ?Patient in bed with her husband int he room at end of session with breaks locked, bed alarm off due to patient's husband being cleared for in room mobility with patient, and all  needs within reach.  ? ?Session 2: ?Patient in bed with her 3 daughters in the room [place], homework from OT this morning, upon PT arrival. Patient alert and agreeable to PT session. Patient r

## 2021-05-12 NOTE — Discharge Instructions (Addendum)
Inpatient Rehab Discharge Instructions ? ?[name] ?Discharge date and time: 05/14/2021 ? ?Activities/Precautions/ Functional Status: ?Activity: no lifting, driving, or strenuous exercise for until cleared by MD ?Diet: regular diet ?Wound Care: none needed ?Functional status:  ?___ No restrictions     ___ Walk up steps independently ?___ 24/7 supervision/assistance   ___ Walk up steps with assistance ?_x__ Intermittent supervision/assistance  ___ Bathe/dress independently ?___ Walk with walker     ___ Bathe/dress with assistance ?___ Walk Independently    ___ Shower independently ?___ Walk with assistance    _x__ Shower with assistance ?__x_ No alcohol     ___ Return to work/school ________ ? ? ?COMMUNITY REFERRALS UPON DISCHARGE:   ? ? ?Outpatient: PT     OT    ST     ?            Agency:[facility]    [phone]  ?            Appointment Date/Time:*please expect follow-up within 7-10 business days to schedule your appointment. If you have not received follow-up, be sure to contact site directly.* ? ? ? ? ?Special Instructions: ? ?No driving, alcohol consumption or tobacco use.  ? ?My questions have been answered and I understand these instructions. I will adhere to these goals and the provided educational materials after my discharge from the hospital. ? ?Patient/Caregiver Signature _______________________________ Date __________ ? ?Clinician Signature _______________________________________ Date __________ ? ?Please bring this form and your medication list with you to all your follow-up doctor's appointments.   ?

## 2021-05-12 NOTE — Plan of Care (Signed)
Behavioral Plan  ? ?[place] Level: VIII ? ?Behavior to decrease/ eliminate:  ?Fall risk ? ?Changes to environment:  ?none ? ?Interventions: ?Patient's husband cleared for in room mobility and showers. ?Universal precautions when patient's husband not in room.  ?Patient has grounds pass.  ? ?Recommendations for interactions with patient: ?Provide TBI education and education for alcohol cessation as able, patient minimally receptive. ? ?Attendees:  ?Serina Cowper, PT, Jake Shark, OT, Kennyth Arnold, RN ?

## 2021-05-12 NOTE — Progress Notes (Signed)
Patient ID: Peggy Vega, female   DOB: 09-Sep-1970, 51 y.o.   MRN: [id number] ? ?Per medical team, pt has made gains in rehab and doing well where she could discharge on Wednesday and recommending outpatient therapy. Therapy would like fam edu tomorrow 9am-12pm.  ? ?SW left message for pt husband [name] to discuss above and waiting on follow-up.  ?*He is agreeable with pt discharge on Wednesday. When discussing outpatient, prefers hospitals- [facility] or [facility]. Will be here for family edu tomorrow with daughters.  ? ?SW met with pt  and discussed above. Pt prfers rehab in [place] if possible to avoid travel. SW shared will attempt these locations as long as they offer all the disciplines for therapy she will need at discharge.  ? ?[name], MSW, LCSWA ?Office: [phone] ?Cell: [phone] ?Fax: [phone]  ?

## 2021-05-12 NOTE — Progress Notes (Signed)
Physical Therapy TBI Note ? ?Patient Details  ?Name: Peggy Vega ?MRN: [id number] ?Date of Birth: 03-07-1970 ? ?Today's Date: 05/12/2021 ?PT Individual Time: [id number] ?PT Individual Time Calculation (min): 45 min  ? ?Short Term Goals: ?Week 1:  PT Short Term Goal 1 (Week 1): STG=LTGs ? ?Skilled Therapeutic Interventions/Progress Updates:  ?   ?Patient in bed working on her laptop with her husband in the room upon PT arrival. Patient alert and agreeable to PT session. Patient reported 4/10 headache pain during session, RN made aware. PT provided repositioning, rest breaks, and distraction as pain interventions throughout session. Reports headaches improving overall. Baseline L knee pain present, but patient declined intervention. ? ?Focused session on HEP for improved knee stability to reduce fall risk from chronic knee injury and TBI education with patient and her husband. Set up family education with patient's husband for tomorrow from 9am-12pm, will discuss d/c date with team today due to short ELOS.  ? ?Educated on symptoms of TBI, events that increased symptoms such as lack of sleep, overstimulation, long term focused attention (computer work), reduced frustration tolerance with daily activities, emotional dysregulation, headache management, and benefits of daily routine and daily exercise. Patient and her husband receptive of all education.  ? ?Therapeutic Activity: ?Bed Mobility: Patient performed supine to/from sit independently. ?Transfers: Patient performed sit to/from stand x2 with distant supervision.  ? ?Gait Training:  ?Patient ambulated >50 feet x2 without AD with supervision. Ambulated with decreased gait speed and stride length with increased lateral trunk sway. Provided verbal cues for lateral hip activation in stance and increased gait speed as tolerated. Patient reports increased joint stiffness in am, baseline.  ? ?Therapeutic Exercise: ?Patient performed 1 set of the following exercises  provided with HEP handout with verbal and tactile cues for proper technique. Educated on importance of quad and gluteal strength for improved L knee stability due to hx of ACL injury and genu valgus, patient very receptive.  ?Access Code: [email or web address] ?- Sidelying Hip Abduction  - 3 x daily - 7 x weekly - 2 sets - 10 reps ?- Supine Active Straight Leg Raise  - 3 x daily - 7 x weekly - 2 sets - 10 reps ?- Supine Bridge  - 3 x daily - 7 x weekly - 2 sets - 10 reps ?- Sit to Stand  - 3 x daily - 7 x weekly - 2 sets - 10 reps ? ?Patient sitting up in bed with her husband in the room at end of session with breaks locked, no alarm set due to patient's husband being cleared for transfers per safety plan, and all needs within reach.  ? ?Therapy Documentation ?Precautions:  ?Precautions ?Precautions: Fall ?Restrictions ?Weight Bearing Restrictions: No ?Agitated Behavior Scale: ?TBI ?Observation Details ?Observation Environment: pt room ?Start of observation period - Date: 05/12/21 ?Start of observation period - Time: 0800 ?End of observation period - Date: 05/12/21 ?End of observation period - Time: 0845 ?Agitated Behavior Scale (DO NOT LEAVE BLANKS) ?Short attention span, easy distractibility, inability to concentrate: Absent ?Impulsive, impatient, low tolerance for pain or frustration: Absent ?Uncooperative, resistant to care, demanding: Absent ?Violent and/or threatening violence toward people or property: Absent ?Explosive and/or unpredictable anger: Absent ?Rocking, rubbing, moaning, or other self-stimulating behavior: Absent ?Pulling at tubes, restraints, etc.: Absent ?Wandering from treatment areas: Absent ?Restlessness, pacing, excessive movement: Absent ?Repetitive behaviors, motor, and/or verbal: Absent ?Rapid, loud, or excessive talking: Absent ?Sudden changes of mood: Absent ?Easily initiated or excessive crying and/or laughter: Absent ?Self-abusiveness,  physical and/or verbal: Absent ?Agitated behavior scale total  score: 15 ? ? ? ?Therapy/Group: Individual Therapy ? ?Doreene Burke PT, DPT ? ?05/12/2021, 12:54 PM  ?

## 2021-05-12 NOTE — Progress Notes (Signed)
?                                                       PROGRESS NOTE ? ? ?Subjective/Complaints:  ? Patient doing well today with husband at bedside. Had therapy (PT, OT, Speech) yesterday.  ? ?Objective:  Denies headache. Reports mild myofascial neck pain has improved with lidocaine patch working well.  Reports that she feels much better now that she has had a BM yesterday 05/11/21 after taking her home medication plecanatide/Trulance. ? ?  ?No results found. ?No results for input(s): WBC, HGB, HCT, PLT in the last 72 hours. ? ?No results for input(s): NA, K, CL, CO2, GLUCOSE, BUN, CREATININE, CALCIUM in the last 72 hours. ? ? ?Intake/Output Summary (Last 24 hours) at 05/12/2021 1123 ?Last data filed at 05/12/2021 0700 ?Gross per 24 hour  ?Intake 720 ml  ?Output --  ?Net 720 ml  ? ?  ? ?  ? ?Physical Exam: ?Vital Signs ?Blood pressure 112/81, pulse 77, temperature 97.8 ?F (36.6 ?C), temperature source Oral, resp. rate 18, height 5\' 4"  (1.626 m), weight 72.7 kg, SpO2 97 %. ? ?Physical Exam ?Constitutional:   ?   Appearance: Normal appearance.  ?HENT:  ?   Head: Normocephalic and atraumatic.  ?   Nose: Nose normal.  ?   Mouth/Throat:  ?   Mouth: Mucous membranes are moist.  ?Eyes:  ?   Pupils: Pupils are equal, round, and reactive to light.  ?Cardiovascular:  ?   Rate and Rhythm: Normal rate and regular rhythm.  ?   Pulses: Normal pulses.  ?   Heart sounds: Normal heart sounds.  ?Pulmonary:  ?   Effort: Pulmonary effort is normal.  ?Abdominal:  ?   General: Abdomen is flat.  ?   Palpations: Abdomen is soft.  ?Musculoskeletal:     ?   General: Normal range of motion.  ?   Cervical back: Normal range of motion and neck supple. No tenderness.  ?Skin: ?   General: Skin is warm and dry.  ?   Capillary Refill: Capillary refill takes less than 2 seconds.  ?Neurological:  ?   Mental Status: She is alert and oriented to person, place, and time.  ?Psychiatric:     ?   Mood and Affect: Mood normal.  ?  ?Unchanged today 05/12/21 since  last exam. ? ?Assessment/Plan: ?1. Functional deficits which require 3+ hours per day of interdisciplinary therapy in a comprehensive inpatient rehab setting. ?Physiatrist is providing close team supervision and 24 hour management of active medical problems listed below. ?Physiatrist and rehab team continue to assess barriers to discharge/monitor patient progress toward functional and medical goals ? ?Care Tool: ? ?Bathing ?   ?Body parts bathed by patient: Right arm, Left arm, Chest, Abdomen, Front perineal area, Buttocks, Right upper leg, Left upper leg, Right lower leg, Left lower leg, Face  ?   ?  ?  ?Bathing assist Assist Level: Contact Guard/Touching assist ?  ?  ?Upper Body Dressing/Undressing ?Upper body dressing   ?What is the patient wearing?: Pull over shirt ?   ?Upper body assist Assist Level: Contact Guard/Touching assist ?   ?Lower Body Dressing/Undressing ?Lower body dressing ? ? ?   ?What is the patient wearing?: Underwear/pull up, Pants ? ?  ? ?Lower body assist Assist  for lower body dressing: Contact Guard/Touching assist ?   ? ?Toileting ?Toileting    ?Toileting assist Assist for toileting: Contact Guard/Touching assist ?  ?  ?Transfers ?Chair/bed transfer ? ?Transfers assist ?   ? ?Chair/bed transfer assist level: Contact Guard/Touching assist ?  ?  ?Locomotion ?Ambulation ? ? ?Ambulation assist ? ?   ? ?Assist level: Contact Guard/Touching assist ?Assistive device: No Device ?Max distance: 200  ? ?Walk 10 feet activity ? ? ?Assist ?   ? ?Assist level: Contact Guard/Touching assist ?Assistive device: No Device  ? ?Walk 50 feet activity ? ? ?Assist   ? ?Assist level: Contact Guard/Touching assist ?Assistive device: No Device  ? ? ?Walk 150 feet activity ? ? ?Assist   ? ?Assist level: Contact Guard/Touching assist ?Assistive device: No Device ?  ? ?Walk 10 feet on uneven surface  ?activity ? ? ?Assist   ? ? ?Assist level: Contact Guard/Touching assist ?   ? ?Wheelchair ? ? ? ? ?Assist Is the  patient using a wheelchair?: No ?  ?  ? ?  ?   ? ? ?Wheelchair 50 feet with 2 turns activity ? ? ? ?Assist ? ?  ?  ? ? ?   ? ?Wheelchair 150 feet activity  ? ? ? ?Assist ?   ? ? ?   ? ?Blood pressure 112/81, pulse 77, temperature 97.8 ?F (36.6 ?C), temperature source Oral, resp. rate 18, height 5\' 4"  (1.626 m), weight 72.7 kg, SpO2 97 %. ? ?Medical Problem List and Plan: ?1. 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            -patient may shower and wash hair carefully ?            -Hair unmated now and able to visualize hematoma ?            -ELOS/Goals: 7-10 days- supervision to mod I discussed team conf date is tues ?            -3/31 hematoma visible with some dried blood but skin intact and healing ? -05/12/21 Hematoma improving, patient reports less painful, patient's hair is braided. ?2.  Antithrombotics:-DVT/anticoagulation:  Mechanical: Sequential compression devices, below knee Bilateral lower extremities ?            -antiplatelet therapy: None ?3. Pain Management: Tylenol, hydrocodone, Robaxin as needed ?4. Mood: LCSW to evaluate and provide emotional support ?            -Continue Zoloft 100 mg daily ?            -antipsychotic agents: n/a ?5. Neuropsych: This patient is mostly capable of making decisions on her own behalf. But has decreased insight into deficits ?6. Skin/Wound Care: Routine skin care checks ?7. Fluids/Electrolytes/Nutrition: Routine I's and O's and follow-up chemistries ?8. Hyponatremia: s/p TBI improving from admission 3/26 Na= 117, 3/31 Na= 132, continue to trend. ?9: Tonic-clonic seizure/TBI: Continue Keppra 1000 mg twice daily ?10: Alcohol abuse: Provide cessation counseling, associated risks ?11: Hypokalemia: KCl 40 mEq daily x 2 doses; resolved, continue weekly BMP ?12. Impaired balance-PT and OT for treatment  ?13. Constipation- not clear when had LBM.  Last reported BM 05/06/21 ?3/31 Patient self-reports that it has  been several days. Patient takes Trulance at home but doesn't have with her, which is likely why she currently has constipation. Will have patient bring from home.  Adjusted frequency of Miralax form once daily to BID. ?4/3 Patient  reports BM yesterday (05/11/21) after taking home plecanatide/Trulance. ?14. Headache/neck pain- myofascial most likely from whiplash, now improved lidocaine patch for shoulder pain is working well. Headache complaints have resolved.  Will continue to monitor. ?  ?May have therapeutic grounds pass with husband, needs WC ?LOS: ?4 days ?A FACE TO FACE EVALUATION WAS PERFORMED ? ?Tressia Miners, NP ?05/12/2021, 11:23 AM  ? ?  ?

## 2021-05-12 NOTE — Care Management (Signed)
Inpatient Rehabilitation Center ?Individual Statement of Services ? ?Patient Name:  Peggy Vega  ?Date:  05/12/2021 ? ?Welcome to the [facility].  Our goal is to provide you with an individualized program based on your diagnosis and situation, designed to meet your specific needs.  With this comprehensive rehabilitation program, you will be expected to participate in at least 3 hours of rehabilitation therapies Monday-Friday, with modified therapy programming on the weekends. ? ?Your rehabilitation program will include the following services:  Physical Therapy (PT), Occupational Therapy (OT), Speech Therapy (ST), 24 hour per day rehabilitation nursing, Therapeutic Recreaction (TR), Psychology, Neuropsychology, Care Coordinator, Rehabilitation Medicine, Nutrition Services, Pharmacy Services, and Other ? ?Weekly team conferences will be held on Tuesdays to discuss your progress.  Your Inpatient Rehabilitation Care Coordinator will talk with you frequently to get your input and to update you on team discussions.  Team conferences with you and your family in attendance may also be held. ? ?Expected length of stay: 7-10 days ? ?Overall anticipated outcome: Supervision ? ?Depending on your progress and recovery, your program may change. Your Inpatient Rehabilitation Care Coordinator will coordinate services and will keep you informed of any changes. Your Inpatient Rehabilitation Care Coordinator's name and contact numbers are listed  below. ? ?The following services may also be recommended but are not provided by the [facility]:  ?Driving Evaluations ?Home Health Rehabiltiation Services ?Outpatient Rehabilitation Services ?Vocational Rehabilitation ?  ?Arrangements will be made to provide these services after discharge if needed.  Arrangements include referral to agencies that provide these services. ? ?Your insurance has been verified to be:  [place] ? ?Your primary doctor is:   No PCP ? ?Pertinent information will be shared with your doctor and your insurance company. ? ?Inpatient Rehabilitation Care Coordinator:  Cathleen Corti [phone] or (C) [phone] ? ?Information discussed with and copy given to patient by: Rana Snare, 05/12/2021, 10:09 AM    ?

## 2021-05-12 NOTE — Progress Notes (Signed)
Speech Language Pathology TBI Note ? ?Patient Details  ?Name: Peggy Vega ?MRN: [id number] ?Date of Birth: [birth year] ? ?Today's Date: 05/12/2021 ?SLP Individual Time: 3300-7622 ?SLP Individual Time Calculation (min): 1437 min ? ?Short Term Goals: ?Week 1: SLP Short Term Goal 1 (Week 1): STG=LTG due to ELOS ? ?Skilled Therapeutic Interventions:Skilled ST services focused on cognitive skills. SLP facilitated complex problem solving, working memory and error awareness in 4 scheduling task, 3 deductive reasoning task and 1 account balancing task. Pt required supervision A verbal cues fade to mod I for problem solving and min A fade to supervision A for error awareness impacted by impulsively. Pt was left in room with call bell within reach and bed alarm set. SLP recommends to continue skilled services. ?   ? ?Pain ?Pain Assessment ?Pain Scale: 0-10 ?Pain Score: 0-No pain ? ?Agitated Behavior Scale: ?TBI ?Observation Details ?Observation Environment: pt room ?Start of observation period - Date: 05/12/21 ?Start of observation period - Time: 0950 ?End of observation period - Date: 05/12/21 ?End of observation period - Time: 1045 ?Agitated Behavior Scale (DO NOT LEAVE BLANKS) ?Short attention span, easy distractibility, inability to concentrate: Absent ?Impulsive, impatient, low tolerance for pain or frustration: Present to a slight degree ?Uncooperative, resistant to care, demanding: Absent ?Violent and/or threatening violence toward people or property: Absent ?Explosive and/or unpredictable anger: Absent ?Rocking, rubbing, moaning, or other self-stimulating behavior: Absent ?Pulling at tubes, restraints, etc.: Absent ?Wandering from treatment areas: Absent ?Restlessness, pacing, excessive movement: Absent ?Repetitive behaviors, motor, and/or verbal: Absent ?Rapid, loud, or excessive talking: Absent ?Sudden changes of mood: Absent ?Easily initiated or excessive crying and/or laughter: Absent ?Self-abusiveness,  physical and/or verbal: Absent ?Agitated behavior scale total score: 15 ? ?Therapy/Group: Individual Therapy ? ?Rumi Kolodziej ?05/12/2021, 11:35 AM ?

## 2021-05-12 NOTE — Progress Notes (Signed)
Occupational Therapy Session Note ? ?Patient Details  ?Name: Peggy Vega ?MRN: [id number] ?Date of Birth: 02-03-1971 ? ?Today's Date: 05/12/2021 ?OT Individual Time: 6773-7366 ?OT Individual Time Calculation (min): 30 min  ? ? ?Short Term Goals: ?Week 1:  OT Short Term Goal 1 (Week 1): STG=LTG d/t ELOS ? ?Skilled Therapeutic Interventions/Progress Updates:  ?  Pt received supine with no c/o pain, agreeable to OT session. Initially focused on insight/awareness when planning a home routine. Pt still adamant that she will return to work immediately. Provided education on CLOF and need for both rest and slow integration back into her high demands/stress job. Will reinforce education with her husband tomorrow.  She completed 150 ft of functional mobility with mod I overall. She completed a dual processing task with a full body circuit strengthening routine and math problem solving. Improvement from yesterday's performance with a similar task. She returned to her room and was left supine with all needs met.  ? ? ?Therapy Documentation ?Precautions:  ?Precautions ?Precautions: Fall ?Restrictions ?Weight Bearing Restrictions: No ? ? ?Therapy/Group: Individual Therapy ? ?Curtis Sites ?05/12/2021, 6:44 AM ?

## 2021-05-13 MED ORDER — TOPIRAMATE 25 MG PO TABS
25.0000 mg | ORAL_TABLET | Freq: Once | ORAL | Status: AC
  Administered 2021-05-13: 25 mg via ORAL
  Filled 2021-05-13: qty 1

## 2021-05-13 MED ORDER — TOPIRAMATE 25 MG PO TABS
25.0000 mg | ORAL_TABLET | Freq: Every day | ORAL | Status: DC
  Administered 2021-05-13: 25 mg via ORAL
  Filled 2021-05-13: qty 1

## 2021-05-13 NOTE — Progress Notes (Signed)
Asked by staff to re-evaluate scalp laceration due to bleeding. This was not closed at time of presentation. Healing without signs of infection. Small skin edge area of oozing. Laceration approximately 2-3 cm cleaned with sterile NS pads. Hair clipped away from skin edges and covered with Benzoin and steri-strips. ?

## 2021-05-13 NOTE — Progress Notes (Signed)
Inpatient Rehabilitation Care Coordinator ?Assessment and Plan ?Patient Details  ?Name: Peggy Vega ?MRN: [id number] ?Date of Birth: [birth year] ? ?Today's Date: 05/13/2021 ? ?Hospital Problems: Principal Problem: ?  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 ?Past Medical History:  ?Past Medical History:  ?Diagnosis Date  ? Depression   ? ETOH abuse   ? Hyponatremia   ? Seizure ([place])   ? ?Past Surgical History: History reviewed. No pertinent surgical history. ?Social History:  reports that she has never smoked. She has never used smokeless tobacco. She reports current alcohol use. She reports that she does not use drugs. ? ?Family / Support Systems ?Marital Status: Married ?How Long?: 20 years ?Patient Roles: Spouse, Parent ?Spouse/Significant Other: [name] (husband) [phone] ?Children: 4 children; 2 adult children from first marriage, and tewo 2 y.o twin daughters that will go off to college this year. ?Other Supports: None reported ?Anticipated Caregiver: Husband ?Ability/Limitations of Caregiver: None reported ?Caregiver Availability: 24/7 ?Family Dynamics: Pt lives with her husband and two twin daughters. ? ?Social History ?Preferred language: English ?Religion:  ?Cultural Background: Pt has worked as Engineer, technical sales for 70yr. 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 ?Health Literacy - How often do you need to have someone help you when you read instructions, pamphlets, or other written material from your doctor or pharmacy?: Never ?Writes: Yes ?Employment Status: Employed ?Name of Employer: [employer]?Return to Work Plans: TBD ?Legal History/Current Legal Issues: Denies ?Guardian/Conservator: N/A  ? ?Abuse/Neglect ?Abuse/Neglect Assessment Can Be Completed: Yes ?Physical Abuse: Denies ?Verbal Abuse: Denies ?Sexual Abuse: Denies ?Exploitation of patient/patient's resources: Denies ?Self-Neglect:  Denies ? ?Patient response to: ?Social Isolation - How often do you feel lonely or isolated from those around you?: Never ? ?Emotional Status ?Pt's affect, behavior and adjustment status: Pt in good spirits at time of visit. Pt was working on her laptop at time of visit. She appears to be adjusting as well as possible. ?Recent Psychosocial Issues: She reports a recent increase in wellbutrin from 150 to 200 which was recommended. ?Psychiatric History: Hx of depression and anxiety. Telepsych provider- Dr. ARoberto Scales visits every 90 days. ?Substance Abuse History: Pt reports she quit smoking cigarettes when she was pregnant with her dtr in 956 She admits that she drinks beer or wine and states " I drink alot," and states she is trying to cut back to only the weekends. Declined resources to help with sobriety. ? ?Patient / Family Perceptions, Expectations & Goals ?Pt/Family understanding of illness & functional limitations: Pt and family have a general understanding of pt care needs ?Premorbid pt/family roles/activities: Independent ?Anticipated changes in roles/activities/participation: Assistance with some ADLs/IADLs ?Pt/family expectations/goals: Pt goal is to work on getting back to normal. ? ?[employer]?Community Agencies: None ?Premorbid Home Care/DME Agencies: None ?Transportation available at discharge: Husband ?Is the patient able to respond to transportation needs?: Yes ?In the past 12 months, has lack of transportation kept you from medical appointments or from getting medications?: No ?In the past 12 months, has lack of transportation kept you from meetings, work, or from getting things needed for daily living?: No ?Resource referrals recommended: Neuropsychology ? ?Discharge Planning ?Living Arrangements: Spouse/significant other, Children ?Support Systems: Spouse/significant other, Children ?Type of Residence: Private residence ?Insurance Resources: PMultimedia programmer(specify) (Nurse, mental health ?Financial  Resources: Employment ?Financial Screen Referred: No ?Living Expenses: Own ?Money Management: Patient ?Does the patient have any problems obtaining your medications?: No ?Home  Management: Pt and husband equally complete home care duties. ?Patient/Family Preliminary Plans: No changes ?Care Coordinator Anticipated Follow Up Needs: HH/OP ?Expected length of stay: d/c on 4/5 ? ?Clinical Impression ?SW met with pt in room to introduce self, explain role, and discuss discharge process. 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 Reports HCPOA forms need to be updated. Unsure on what DME remains from knee surgeries she has had in the past.  ? ?Rana Snare ?05/13/2021, 9:03 AM ? ?  ?

## 2021-05-13 NOTE — Progress Notes (Addendum)
Physical Therapy Discharge Summary ? ?Patient Details  ?Name: Peggy Vega ?MRN: [id number] ?Date of Birth: Jan 08, 1971 ? ?Today's Date: 05/13/2021 ? ? ?Patient has met 10 of 10 long term goals due to improved activity tolerance, improved balance, improved postural control, decreased pain, improved attention, improved awareness, and improved coordination.  Patient to discharge at an ambulatory level Supervision.   Patient's care partner is independent to provide the necessary cognitive assistance at discharge. ? ?Reasons goals not met: All PT goals met. ? ?Recommendation:  ?Patient will benefit from ongoing skilled PT services in outpatient setting to continue to advance safe functional mobility, address ongoing impairments in balance, activity tolerance, gait and stair training, community integration, L knee/hip strengthening/stability, TBI education, attention, safety awareness, and minimize fall risk. ? ?Equipment: ?No equipment provided ? ?Reasons for discharge: treatment goals met ? ?Patient/family agrees with progress made and goals achieved: Yes ? ?PT Discharge ?Precautions/Restrictions ?Restrictions ?Weight Bearing Restrictions: No ?Pain Interference ?Pain Interference ?Pain Effect on Sleep: 2. Occasionally ?Pain Interference with Therapy Activities: 1. Rarely or not at all ?Pain Interference with Day-to-Day Activities: 1. Rarely or not at all ?Vision/Perception  ?Vision - History ?Ability to See in Adequate Light: 0 Adequate ?Vision - Assessment ?Eye Alignment: Within Functional Limits ?Ocular Range of Motion: Within Functional Limits ?Alignment/Gaze Preference: Within Defined Limits ?Convergence: Within functional limits ?Perception ?Perception: Within Functional Limits ?Praxis ?Praxis: Intact  ?Cognition ?Overall Cognitive Status: Impaired/Different from baseline ?Arousal/Alertness: Awake/alert ?Orientation Level: Oriented X4 ?Sustained Attention: Appears intact ?Selective Attention: Impaired ?Selective  Attention Impairment: Verbal complex;Functional complex ?Memory: Impaired ?Memory Impairment: Storage deficit;Decreased short term memory ?Decreased Short Term Memory: Verbal basic ?Awareness: Impaired ?Awareness Impairment: Emergent impairment;Anticipatory impairment ?Behaviors: Poor frustration tolerance ?Safety/Judgment: Impaired ?Comments: Safety awareness improving overall, recommending supervision at d/c initally due to mild impulsivity and decreased judgement and self regulation with appropriate return to daily activities. ?[employer] Scales of Cognitive Functioning: Purposeful/appropriate ?Sensation ?Sensation ?Light Touch: Appears Intact ?Proprioception: Appears Intact ?Coordination ?Gross Motor Movements are Fluid and Coordinated: Yes ?Fine Motor Movements are Fluid and Coordinated: Yes ?Motor  ?Motor ?Motor: Abnormal postural alignment and control ?Motor - Discharge Observations: Mild impairments with postural control with dynamic activities and selective or divided attention with motor tasks  ?Mobility ?Bed Mobility ?Rolling Right: Independent ?Rolling Left: Independent ?Supine to Sit: Independent ?Sit to Supine: Independent ?Transfers ?Sit to Stand: Independent ?Stand to Sit: Independent ?Stand Pivot Transfers: Independent ?Transfer (Assistive device): None ?Locomotion  ?Gait ?Ambulation: Yes ?Gait Assistance: Supervision/Verbal cueing ?Gait Distance (Feet): 1260 Feet (during 6 min walk test) ?Assistive device: None ?Gait Assistance Details: safety due to attention deficits ?Gait ?Gait: Yes ?Gait Pattern: Lateral hip instability;Decreased trunk rotation;Wide base of support ?Gait velocity: 1.1 m/s avg on 6MWT ?Stairs / Additional Locomotion ?Stairs: Yes ?Stairs Assistance: Supervision/Verbal cueing ?Stair Management Technique: One rail Right ?Number of Stairs: 16 ?Height of Stairs: 6 ?Ramp: Supervision/Verbal cueing ?Curb: Supervision/Verbal cueing ?Wheelchair Mobility ?Wheelchair Mobility: No   ?Trunk/Postural Assessment  ?Cervical Assessment ?Cervical Assessment: Exceptions to [facility] ?Thoracic Assessment ?Thoracic Assessment: Within Functional Limits ?Lumbar Assessment ?Lumbar Assessment: Within Functional Limits ?Postural Control ?Postural Control: Deficits on evaluation ?Righting Reactions: delayed  ?Balance ?Berg Balance Test ?Sit to Stand: Able to stand without using hands and stabilize independently ?Standing Unsupported: Able to stand safely 2 minutes ?Sitting with Back Unsupported but Feet Supported on Floor or Stool: Able to sit safely and securely 2 minutes ?Stand to Sit: Sits safely with minimal use of hands ?Transfers: Able to transfer safely, minor  use of hands ?Standing Unsupported with Eyes Closed: Able to stand 10 seconds safely ?Standing Ubsupported with Feet Together: Able to place feet together independently and stand 1 minute safely ?From Standing, Reach Forward with Outstretched Arm: Can reach confidently >25 cm (10") ?From Standing Position, Pick up Object from Floor: Able to pick up shoe safely and easily ?From Standing Position, Turn to Look Behind Over each Shoulder: Looks behind from both sides and weight shifts well ?Turn 360 Degrees: Able to turn 360 degrees safely in 4 seconds or less ?Standing Unsupported, Alternately Place Feet on Step/Stool: Able to stand independently and safely and complete 8 steps in 20 seconds ?Standing Unsupported, One Foot in Front: Able to plae foot ahead of the other independently and hold 30 seconds (pt reports she has hx of L knee surgery) ?Standing on One Leg: Able to lift leg independently and hold > 10 seconds ?Total Score: 55 ?Dynamic Sitting Balance ?Dynamic Sitting - Level of Assistance: 7: Independent ?Static Standing Balance ?Static Standing - Level of Assistance: 7: Independent ?Dynamic Standing Balance ?Dynamic Standing - Level of Assistance: 7: Independent ?Functional Gait  Assessment ?Gait Level Surface: Walks 20 ft in less than 5.5 sec,  no assistive devices, good speed, no evidence for imbalance, normal gait pattern, deviates no more than 6 in outside of the 12 in walkway width. ?Change in Gait Speed: Able to smoothly change walking speed without loss of balance or gait deviation. Deviate no more than 6 in outside of the 12 in walkway width. ?Gait with Horizontal Head Turns: Performs head turns smoothly with slight change in gait velocity (eg, minor disruption to smooth gait path), deviates 6-10 in outside 12 in walkway width, or uses an assistive device. ?Gait with Vertical Head Turns: Performs task with slight change in gait velocity (eg, minor disruption to smooth gait path), deviates 6 - 10 in outside 12 in walkway width or uses assistive device ?Gait and Pivot Turn: Turns slowly, requires verbal cueing, or requires several small steps to catch balance following turn and stop ?Step Over Obstacle: Is able to step over 2 stacked shoe boxes taped together (9 in total height) without changing gait speed. No evidence of imbalance. ?Gait with Narrow Base of Support: Ambulates 4-7 steps. ?Gait with Eyes Closed: Walks 20 ft, uses assistive device, slower speed, mild gait deviations, deviates 6-10 in outside 12 in walkway width. Ambulates 20 ft in less than 9 sec but greater than 7 sec. ?Ambulating Backwards: Walks 20 ft, uses assistive device, slower speed, mild gait deviations, deviates 6-10 in outside 12 in walkway width. ?Steps: Alternating feet, no rail. ?Total Score: 22/56 ?Extremity Assessment  ?RLE Assessment ?RLE Assessment: Within Functional Limits ?LLE Assessment ?LLE Assessment: Within Functional Limits ?General Strength Comments: Mild hip abductor and quadriceps weakness with fatigue, L knee buckling at basline due to previous ACL injury ? ? ? ?Kasiah Manka L Lilliane Sposito PT, DPT.CBIS ?05/13/2021, 5:06 PM ?

## 2021-05-13 NOTE — Progress Notes (Signed)
Inpatient Rehabilitation Discharge Medication Review by a Pharmacist ? ?A complete drug regimen review was completed for this patient to identify any potential clinically significant medication issues. ? ?High Risk Drug Classes Is patient taking? Indication by Medication  ?Antipsychotic No   ?Anticoagulant No   ?Antibiotic No   ?Opioid Yes Norco- acute pain  ?Antiplatelet No   ?Hypoglycemics/insulin No   ?Vasoactive Medication No   ?Chemotherapy No   ?Other No   ? ? ? ?Type of Medication Issue Identified Description of Issue Recommendation(s)  ?Drug Interaction(s) (clinically significant) ?    ?Duplicate Therapy ?    ?Allergy ?    ?No Medication Administration End Date ?    ?Incorrect Dose ?    ?Additional Drug Therapy Needed ?    ?Significant med changes from prior encounter (inform family/care partners about these prior to discharge).    ?Other ?    ? ? ?Clinically significant medication issues were identified that warrant physician communication and completion of prescribed/recommended actions by midnight of the next day:  No ? ? ?Pharmacist comments: Patient's own medication used during admission, Plecanatide, returned at discharge. Nursing to pick up in main pharmacy.  ? ?Time spent performing this drug regimen review (minutes):  30 ? ? ?Tiant Peixoto BS, PharmD, BCPS ?Clinical Pharmacist ?05/14/2021 10:59 AM ?

## 2021-05-13 NOTE — Progress Notes (Signed)
Occupational Therapy Discharge Summary ? ?Patient Details  ?Name: Peggy Vega ?MRN: [id number] ?Date of Birth: 23-Nov-1970 ? ?Today's Date: 05/13/2021 ?OT Individual Time: 0109-3235 ?OT Individual Time Calculation (min): 10 min  ? ? ?Patient has met 8 of 8 long term goals due to improved activity tolerance, improved balance, postural control, ability to compensate for deficits, improved attention, improved awareness, and improved coordination.  Patient to discharge at overall Modified Independent level.  Patient's husband Peggy Vega is independent to provide the necessary cognitive assistance at discharge and has completed family education. Peggy Vega has made good progress on CIR, progressing to a mod I level with all transfers and bathing/dressing. Recommended (S) for shower transfers for safety initially. Peggy Vega's Vega deficits remain awareness and insight into deficits. Copious education has been provided on the need for her to wait for return to work and abstain from drinking alcohol.  ? ?Reasons goals not met: All goals met.  ? ?Recommendation:  ?Patient will benefit from ongoing skilled OT services in outpatient setting to continue to advance functional skills in the area of BADL and iADL. ? ?Equipment: ?No equipment provided ? ?Reasons for discharge: treatment goals met and discharge from hospital ? ?Patient/family agrees with progress made and goals achieved: Yes ? ?Skilled OT Intervention: ?Pt received supine with no c/o pain. Noticed significant amount of blood on pillow and alerted PA to wound. Reviewed discharge recommendations and completed final testing. PA entered room and took over care to address head wound. 20 min missed.  ? ?OT Discharge ?Precautions/Restrictions  ?Precautions ?Precautions: None ?Restrictions ?Weight Bearing Restrictions: No ?  ?Vital Signs ?Therapy Vitals ?Temp: 97.7 ?F (36.5 ?C) ?Temp Source: Oral ?Pulse Rate: 88 ?Resp: 16 ?BP: 125/72 ?Patient Position (if appropriate): Lying ?Oxygen  Therapy ?SpO2: 99 % ?O2 Device: Room Air ?Pain ?Pain Assessment ?Pain Scale: 0-10 ?Pain Score: 0-No pain ?ADL ?ADL ?Eating: Independent ?Where Assessed-Eating: Edge of bed ?Grooming: Independent ?Where Assessed-Grooming: Standing at sink ?Upper Body Bathing: Independent ?Where Assessed-Upper Body Bathing: Shower ?Lower Body Bathing: Independent ?Where Assessed-Lower Body Bathing: Shower ?Upper Body Dressing: Independent ?Where Assessed-Upper Body Dressing: Standing at sink ?Lower Body Dressing: Independent ?Where Assessed-Lower Body Dressing: Sitting at sink ?Toileting: Independent ?Where Assessed-Toileting: Toilet ?Toilet Transfer: Independent ?Toilet Transfer Method: Ambulating ?Tub/Shower Transfer: Independent ?Walk-In Shower Transfer: Independent ?Walk-In Shower Equipment: Radio broadcast assistant ?Vision ?Baseline Vision/History: 1 Wears glasses ?Patient Visual Report: No change from baseline ?Vision Assessment?: No apparent visual deficits ?Perception  ?Perception: Within Functional Limits ?Praxis ?Praxis: Intact ?Cognition ?Cognition ?Overall Cognitive Status: Impaired/Different from baseline ?Arousal/Alertness: Awake/alert ?Orientation Level: Person;Place;Situation ?Person: Oriented ?Place: Oriented ?Situation: Oriented ?Memory: Impaired ?Memory Impairment: Storage deficit;Decreased short term memory ?Decreased Short Term Memory: Verbal basic ?Attention: Selective;Alternating ?Selective Attention: Appears intact ?Awareness: Impaired ?Awareness Impairment: Anticipatory impairment ?Problem Solving: Impaired ?Problem Solving Impairment: Verbal complex ?Safety/Judgment: Impaired ?Comments: Safety awareness improving overall, recommending supervision at d/c initally due to mild impulsivity and decreased judgement and self regulation with appropriate return to daily activities. ?[employer] Scales of Cognitive Functioning: Purposeful/appropriate ?Brief Interview for Mental Status (BIMS) ?Repetition of Three Words  (First Attempt): 3 ?Temporal Orientation: Year: Correct ?Temporal Orientation: Month: Accurate within 5 days ?Temporal Orientation: Day: Correct ?Recall: "Sock": No, could not recall ?Recall: "Blue": Yes, no cue required ?Recall: "Bed": Yes, no cue required ?BIMS Summary Score: 13 ?Sensation ?Sensation ?Light Touch: Appears Intact ?Hot/Cold: Appears Intact ?Proprioception: Appears Intact ?Stereognosis: Appears Intact ?Coordination ?Gross Motor Movements are Fluid and Coordinated: Yes ?Fine Motor Movements are Fluid and Coordinated: Yes ?Motor  ?  Motor ?Motor: Abnormal postural alignment and control ?Motor - Discharge Observations: Mild impairments with postural control with dynamic activities and selective or divided attention with motor tasks ?Mobility  ?Bed Mobility ?Bed Mobility: Rolling Right;Rolling Left;Supine to Sit;Sit to Supine ?Rolling Right: Independent ?Rolling Left: Independent ?Supine to Sit: Independent ?Sit to Supine: Independent ?Transfers ?Sit to Stand: Independent ?Stand to Sit: Independent  ?Trunk/Postural Assessment  ?Cervical Assessment ?Cervical Assessment: Within Functional Limits ?Thoracic Assessment ?Thoracic Assessment: Within Functional Limits ?Lumbar Assessment ?Lumbar Assessment: Within Functional Limits ?Postural Control ?Postural Control: Deficits on evaluation ?Righting Reactions: delayed  ?Balance ?Balance ?Balance Assessed: Yes ?Berg Balance Test ?Sit to Stand: Able to stand without using hands and stabilize independently ?Standing Unsupported: Able to stand safely 2 minutes ?Sitting with Back Unsupported but Feet Supported on Floor or Stool: Able to sit safely and securely 2 minutes ?Stand to Sit: Sits safely with minimal use of hands ?Transfers: Able to transfer safely, minor use of hands ?Standing Unsupported with Eyes Closed: Able to stand 10 seconds safely ?Standing Ubsupported with Feet Together: Able to place feet together independently and stand 1 minute safely ?From  Standing, Reach Forward with Outstretched Arm: Can reach confidently >25 cm (10") ?From Standing Position, Pick up Object from Floor: Able to pick up shoe safely and easily ?From Standing Position, Turn to Look Behind Over each Shoulder: Looks behind from both sides and weight shifts well ?Turn 360 Degrees: Able to turn 360 degrees safely in 4 seconds or less ?Standing Unsupported, Alternately Place Feet on Step/Stool: Able to stand independently and safely and complete 8 steps in 20 seconds ?Standing Unsupported, One Foot in Front: Able to plae foot ahead of the other independently and hold 30 seconds (pt reports she has hx of L knee surgery) ?Standing on One Leg: Able to lift leg independently and hold > 10 seconds ?Total Score: 55 ?Dynamic Sitting Balance ?Dynamic Sitting - Balance Support: Feet supported ?Dynamic Sitting - Level of Assistance: 7: Independent ?Static Standing Balance ?Static Standing - Balance Support: No upper extremity supported ?Static Standing - Level of Assistance: 7: Independent ?Dynamic Standing Balance ?Dynamic Standing - Balance Support: During functional activity ?Dynamic Standing - Level of Assistance: 7: Independent ?Functional Gait  Assessment ?Gait Level Surface: Walks 20 ft in less than 5.5 sec, no assistive devices, good speed, no evidence for imbalance, normal gait pattern, deviates no more than 6 in outside of the 12 in walkway width. ?Change in Gait Speed: Able to smoothly change walking speed without loss of balance or gait deviation. Deviate no more than 6 in outside of the 12 in walkway width. ?Gait with Horizontal Head Turns: Performs head turns smoothly with slight change in gait velocity (eg, minor disruption to smooth gait path), deviates 6-10 in outside 12 in walkway width, or uses an assistive device. ?Gait with Vertical Head Turns: Performs task with slight change in gait velocity (eg, minor disruption to smooth gait path), deviates 6 - 10 in outside 12 in walkway  width or uses assistive device ?Gait and Pivot Turn: Turns slowly, requires verbal cueing, or requires several small steps to catch balance following turn and stop ?Step Over Obstacle: Is able to step over 2 sta

## 2021-05-13 NOTE — Progress Notes (Signed)
Speech Language Pathology Discharge Summary ? ?Patient Details  ?Name: Peggy Vega ?MRN: [id number] ?Date of Birth: 17-Sep-1970 ? ?Today's Date: 05/13/2021 ?SLP Individual Time: 8421-0312 ?SLP Individual Time Calculation (min): 53 min ? ?Skilled Therapeutic Interventions: Skilled ST treatment focused on cognitive goals and education with family. Spouse and teenage daughter were present during today's session and receptive to education on cognitive-communication deficits in the areas of higher level cognitive deficits impacting processing, awareness, complex problem solving, and executive functions. Patient was also educated on cognitive fatigue and importance of taking breaks to minimize over-stimulation. Patient appeared mildly distracted by a virtual lecture playing on her computer for a course she is taking. Pt was eventually asked to mute lecture to maximize attention. SLP facilitated medication management and error awareness task with overall mod I to identify organization errors in daily BID & TID pillboxes, independent for generating appropriate solutions, and sup A verbal cues for problem solving hypothetical scenarios pertaining to medication management. Patient reports feeling close to baseline with the exception of "slower processing." Discussed outpatient ST at next level of care. Pt/spouse verbalized understanding and agreement. Patient was left in bed with alarm activated and immediate needs within reach at end of session.  ? ?Patient has met 5 of 5 long term goals.  Patient to discharge at overall Supervision level.  ?Reasons goals not met: All goals met  ? ?Clinical Impression/Discharge Summary: Pt has demonstrated excellent progress towards cognitive-linguistic goals meeting 5 out of 5 long-term goals this admission. Behaviors are most consistent with a [employer] Level VIII. Patient is currently completing functional and complex cognitive tasks with mod I-to-sup A cues in regards to functional  recall, complex problem solving, emergent/anticipatory awareness, and executive functions. Safety awareness is improving overall, recommending supervision at d/c 2/2 mild impulsivity and decreased judgement and self regulation. Pt/family education completed. Care partner is independent to provide the necessary physical and cognitive assistance at discharge. Recommend supervision completion of all iADL tasks to include medication and financial management, in addition to ongoing ST intervention at next level of care in an outpatient setting to maximize cognitive function and functional independence.  ? ?Care Partner:  ?Caregiver Able to Provide Assistance: Yes  ?Type of Caregiver Assistance: Physical;Cognitive ? ?Recommendation:  ?Outpatient SLP  ?Rationale for SLP Follow Up: Maximize cognitive function and independence  ? ?Equipment: None  ? ?Reasons for discharge: Treatment goals met;Discharged from hospital  ? ?Patient/Family Agrees with Progress Made and Goals Achieved: Yes  ? ? ?Yvan Dority T Adasia Hoar ?05/13/2021, 10:44 AM ? ?

## 2021-05-13 NOTE — Patient Care Conference (Addendum)
Inpatient RehabilitationTeam Conference and Plan of Care Update ?Date: 05/13/2021   Time: 10:08 AM  ? ? ?Patient Name: Peggy Vega      ?Medical Record Number: [id number]  ?Date of Birth: 10/16/1970 ?Sex: Female         ?Room/Bed: 4Z66A/6T01S-01 ?Payor Info: Payor: [employer] / Plan: BCBS/F[employer]ERAL [employer] PPO / Product Type: *No Product type* /   ? ?Admit Date/Time:  05/08/2021  4:03 PM ? 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 ?Hospital Problems: Principal Problem: ?  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 ? ? ?Expected Discharge Date: Expected Discharge Date: 05/14/21 ? ?Team Members Present: ?Physician leading conference: Dr. Alger Simons ?Social Worker Present: [name], LCSWA ?Nurse Present: Dorthula Nettles, RN ?PT Present: Apolinar Junes, PT ?OT Present: Laverle Hobby, OT ?SLP Present: Weston Anna, SLP ?PPS Coordinator present : Gunnar Fusi, SLP ? ?   Current Status/Progress Goal Weekly Team Focus  ?Bowel/Bladder ? ? continent b/b  remain continent  toilet as needed   ?Swallow/Nutrition/ Hydration ? ?           ?ADL's ? ? (S)- mod I mobility, decreased processing speed, higher level balance and cognitive impairments  MOD I BADL, S IADL- 24/7 supervision recommended  higher level balance, dual task processing, vision, processing speed, safety awarness   ?Mobility ? ? Supervision-mod I, gait >1200 ft, Berg 55/56, FGA 22/30  Supervision-mod I  d/c planning, family education, TBI education   ?Communication ? ?           ?Safety/Cognition/ Behavioral Observations ? Mod I-supervision A  Supervision A - goals met  education, complex problem solvong,anticipatory awareness,memory and selective   ?Pain ? ? reports headache  < 3  assess pain q 4hr and prn   ?Skin ? ? abrasion to back of head  no new breakdown  assess skin q shift and prn   ? ? ?Discharge Planning:  ?Pt will d/c to home with 24/7 care from husband. Fam edu on  Tues (4/4) 9am-12pm. Outpatient PT/OT/SLP referral sent to [facility].   ?Team Discussion: ?Falls related to ETOH. Seizures related to ETOH. Possible taper of seizure medications. Started Topamax. Continent B/B, abrasion to back of head. Discharging home with family. Recommended to slow down. ?  ?Patient on target to meet rehab goals: ?yes, at goal level. Supervision/mod I high level cognition. Mod I at home, supervision community level.  ? ?*See Care Plan and progress notes for long and short-term goals.  ? ?Revisions to Treatment Plan:  ?Finalizing discharge plans and medications. ?  ?Teaching Needs: ?Family education, medication/pain management, skin/wound care, transfer/gait training, etc. ?  ?Current Barriers to Discharge: ?Decreased caregiver support, Home enviroment access/layout, Wound care, Lack of/limited family support, and Medication compliance ? ?Possible Resolutions to Barriers: ?Family education ?Order recommended DME ?Outpatient follow-up ?  ? ? Medical Summary ?Current Status: tbi with skull fx, etoh related. having headaches still. topamax added today ? Barriers to Discharge: Medical stability ?  ?Possible Resolutions to [employer]: daily assessment of labs and pt data, rx headache, brain injury education ? ? ?Continued Need for Acute Rehabilitation Level of Care: The patient requires daily medical management by a physician with specialized training in physical medicine and rehabilitation for the following reasons: ?Direction of a multidisciplinary physical rehabilitation program to maximize functional independence : Yes ?Medical management of patient stability for increased activity during participation in an intensive rehabilitation  regime.: Yes ?Analysis of laboratory values and/or radiology reports with any subsequent need for medication adjustment and/or medical intervention. : Yes ? ? ?I attest that I was present, lead the team conference, and concur with the assessment  and plan of the team. ? ? ?Dorthula Nettles G ?05/13/2021, 3:55 PM  ? ? ? ? ? ? ?

## 2021-05-13 NOTE — Progress Notes (Signed)
Patient ID: Peggy Vega, female   DOB: 17-Jul-1970, 51 y.o.   MRN: [id number] ? ?SW faxed outpatient PT/OT/SLP referral to [facility] ([phone]/f:[phone]). ?*confirms referral was received.  ? ?SW met with pt, pt husband, and pt dtr in room to provide updates from team conference, and d/c remains 4/5. SW reiterated concerns related to pt limiting the amount she is working at this time, however, pt reluctant to recommendation. Pt informed referral was sent to [facility] outpatient location. No questions/concerns reported. ? ?[name], MSW, LCSWA ?Office: [phone] ?Cell: [phone] ?Fax: [phone]  ? ?

## 2021-05-13 NOTE — Plan of Care (Signed)
?  Problem: Consults ?Goal: RH BRAIN INJURY PATIENT EDUCATION ?Description: Description: See Patient Education module for eduction specifics ?Outcome: Progressing ?  ?Problem: RH SKIN INTEGRITY ?Goal: RH STG MAINTAIN SKIN INTEGRITY WITH ASSISTANCE ?Description: STG Maintain Skin Integrity With Supervision Assistance. ?Outcome: Progressing ?  ?Problem: RH SAFETY ?Goal: RH STG ADHERE TO SAFETY PRECAUTIONS W/ASSISTANCE/DEVICE ?Description: STG Adhere to Safety Precautions With Cues and Reminders. ?Outcome: Progressing ?Goal: RH STG DECREASED RISK OF FALL WITH ASSISTANCE ?Description: STG Decreased Risk of Fall With Supervision Assistance. ?Outcome: Progressing ?  ?Problem: RH COGNITION-NURSING ?Goal: RH STG USES MEMORY AIDS/STRATEGIES W/ASSIST TO PROBLEM SOLVE ?Description: STG Uses Memory Aids/Strategies With Supervision Assistance to Problem Solve. ?Outcome: Progressing ?Goal: RH STG ANTICIPATES NEEDS/CALLS FOR ASSIST W/ASSIST/CUES ?Description: STG Anticipates Needs/Calls for Assist With Cues and Reminders. ?Outcome: Progressing ?  ?Problem: RH PAIN MANAGEMENT ?Goal: RH STG PAIN MANAGED AT OR BELOW PT'S PAIN GOAL ?Description: < 3 on a 0-10 pain scale. ?Outcome: Progressing ?  ?Problem: RH KNOWLEDGE DEFICIT BRAIN INJURY ?Goal: RH STG INCREASE KNOWLEDGE OF SELF CARE AFTER BRAIN INJURY ?Description: Patient will demonstrate knowledge of self-care management, medication/pain management, safety awareness with educational materials and handouts provided by staff independently at discharge. ?Outcome: Progressing ?  ?

## 2021-05-13 NOTE — Plan of Care (Signed)
?  Problem: RH Problem Solving Goal: LTG Patient will demonstrate problem solving for (SLP) Description: LTG:  Patient will demonstrate problem solving for basic/complex daily situations with cues  (SLP) Outcome: Completed/Met   Problem: RH Memory Goal: LTG Patient will demonstrate ability for day to day (SLP) Description: LTG:   Patient will demonstrate ability for day to day recall/carryover during cognitive/linguistic activities with assist  (SLP) Outcome: Completed/Met Goal: LTG Patient will use memory compensatory aids to (SLP) Description: LTG:  Patient will use memory compensatory aids to recall biographical/new, daily complex information with cues (SLP) Outcome: Completed/Met   Problem: RH Attention Goal: LTG Patient will demonstrate this level of attention during functional activites (SLP) Description: LTG:  Patient will will demonstrate this level of attention during functional activites (SLP) Outcome: Completed/Met   Problem: RH Awareness Goal: LTG: Patient will demonstrate awareness during functional activites type of (SLP) Description: LTG: Patient will demonstrate awareness during functional activites type of (SLP) Outcome: Completed/Met   

## 2021-05-13 NOTE — Progress Notes (Addendum)
?                                                       PROGRESS NOTE ? ? ?Subjective/Complaints:  ?Patient doing well today with husband and daughter at bedside. Patient had complaint of headache last night. ? ? ?Objective:  Reports mild headache. Mild myofascial neck pain has resolved with lidocaine patch working well.  Reports that she feels much better now that she has had a BM past two days after taking her home medication plecanatide/Trulance. ? ?  ?No results found. ?No results for input(s): WBC, HGB, HCT, PLT in the last 72 hours. ? ?No results for input(s): NA, K, CL, CO2, GLUCOSE, BUN, CREATININE, CALCIUM in the last 72 hours. ? ? ?Intake/Output Summary (Last 24 hours) at 05/13/2021 0948 ?Last data filed at 05/13/2021 0700 ?Gross per 24 hour  ?Intake 776 ml  ?Output --  ?Net 776 ml  ? ?  ? ?  ? ?Physical Exam: ?Vital Signs ?Blood pressure 108/74, pulse 82, temperature 98.3 ?F (36.8 ?C), resp. rate 14, height 5\' 4"  (1.626 m), weight 72.7 kg, SpO2 99 %. ? ?Physical Exam ?Constitutional:   ?   Appearance: Normal appearance.  ?HENT:  ?   Head: Normocephalic and atraumatic.  ?   Nose: Nose normal.  ?   Mouth/Throat:  ?   Mouth: Mucous membranes are moist.  ?Eyes:  ?   Pupils: Pupils are equal, round, and reactive to light.  ?Cardiovascular:  ?   Rate and Rhythm: Normal rate and regular rhythm.  ?   Pulses: Normal pulses.  ?   Heart sounds: Normal heart sounds.  ?Pulmonary:  ?   Effort: Pulmonary effort is normal.  ?Abdominal:  ?   General: Abdomen is flat.  ?   Palpations: Abdomen is soft.  ?Musculoskeletal:     ?   General: Normal range of motion.  ?   Cervical back: Normal range of motion and neck supple. No tenderness.  ?Skin: ?   General: Skin is warm and dry.  ?   Capillary Refill: Capillary refill takes less than 2 seconds.  ?Neurological:  ?   Mental Status: She is alert and oriented to person, place, and time.  ?Psychiatric:     ?   Mood and Affect: Mood normal.  ?  ?Unchanged today 05/13/21 since last  exam. ? ?Assessment/Plan: ?1. Functional deficits which require 3+ hours per day of interdisciplinary therapy in a comprehensive inpatient rehab setting. ?Physiatrist is providing close team supervision and 24 hour management of active medical problems listed below. ?Physiatrist and rehab team continue to assess barriers to discharge/monitor patient progress toward functional and medical goals ? ?Care Tool: ? ?Bathing ?   ?Body parts bathed by patient: Right arm, Left arm, Chest, Abdomen, Front perineal area, Buttocks, Right upper leg, Left upper leg, Right lower leg, Left lower leg, Face  ?   ?  ?  ?Bathing assist Assist Level: Contact Guard/Touching assist ?  ?  ?Upper Body Dressing/Undressing ?Upper body dressing   ?What is the patient wearing?: Pull over shirt ?   ?Upper body assist Assist Level: Contact Guard/Touching assist ?   ?Lower Body Dressing/Undressing ?Lower body dressing ? ? ?   ?What is the patient wearing?: Underwear/pull up, Pants ? ?  ? ?Lower body assist Assist  for lower body dressing: Contact Guard/Touching assist ?   ? ?Toileting ?Toileting    ?Toileting assist Assist for toileting: Contact Guard/Touching assist ?  ?  ?Transfers ?Chair/bed transfer ? ?Transfers assist ?   ? ?Chair/bed transfer assist level: Contact Guard/Touching assist ?  ?  ?Locomotion ?Ambulation ? ? ?Ambulation assist ? ?   ? ?Assist level: Contact Guard/Touching assist ?Assistive device: No Device ?Max distance: 200  ? ?Walk 10 feet activity ? ? ?Assist ?   ? ?Assist level: Contact Guard/Touching assist ?Assistive device: No Device  ? ?Walk 50 feet activity ? ? ?Assist   ? ?Assist level: Contact Guard/Touching assist ?Assistive device: No Device  ? ? ?Walk 150 feet activity ? ? ?Assist   ? ?Assist level: Contact Guard/Touching assist ?Assistive device: No Device ?  ? ?Walk 10 feet on uneven surface  ?activity ? ? ?Assist   ? ? ?Assist level: Contact Guard/Touching assist ?   ? ?Wheelchair ? ? ? ? ?Assist Is the patient  using a wheelchair?: No ?  ?  ? ?  ?   ? ? ?Wheelchair 50 feet with 2 turns activity ? ? ? ?Assist ? ?  ?  ? ? ?   ? ?Wheelchair 150 feet activity  ? ? ? ?Assist ?   ? ? ?   ? ?Blood pressure 108/74, pulse 82, temperature 98.3 ?F (36.8 ?C), resp. rate 14, height 5\' 4"  (1.626 m), weight 72.7 kg, SpO2 99 %. ? ?Medical Problem List and Plan: ?1. 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            -patient may shower and wash hair carefully ?            -Hair unmated now and able to visualize hematoma ?            -ELOS/Goals: 7-10 days- supervision to mod I discussed team conf date is tues ?            -3/31 hematoma visible with some dried blood but skin intact and healing ? -05/13/21 Hematoma greatly improving, patient reports less painful, patient's hair is braided.  ?2.  Antithrombotics:-DVT/anticoagulation:  Mechanical: Sequential compression devices, below knee Bilateral lower extremities ?            -antiplatelet therapy: None ?3. Pain Management: Tylenol, hydrocodone, Robaxin as needed ?4. Mood: LCSW to evaluate and provide emotional support ?            -Continue Zoloft 100 mg daily ?            -antipsychotic agents: n/a ?5. Neuropsych: This patient is mostly capable of making decisions on her own behalf. But has decreased insight into deficits ?6. Skin/Wound Care: Routine skin care checks ?7. Fluids/Electrolytes/Nutrition: Routine I's and O's and follow-up chemistries ?8. Hyponatremia: s/p TBI improving from admission 3/26 Na= 117, 3/31 Na= 132, continue to trend. ?9: Tonic-clonic seizure/TBI: Continue Keppra 1000 mg twice daily ?10: Alcohol abuse: Provide cessation counseling, associated risks ?11: Hypokalemia: KCl 40 mEq daily x 2 doses; resolved, continue weekly BMP ?12. Impaired balance-PT and OT for treatment  ?13. Constipation- not clear when had LBM.  Last reported BM 05/06/21 ?3/31 Patient self-reports that it has been several days.  Patient takes Trulance at home but doesn't have with her, which is likely why she currently has constipation. Will have patient bring from home.  Adjusted frequency of Miralax form once daily to BID. ?4/3 Patient reports  BM yesterday (05/11/21) after taking home plecanatide/Trulance. ?4/4 Constipation resolved. ?14. Headache/neck pain- myofascial most likely from whiplash, now improved lidocaine patch for shoulder pain is working well. Headache complaints have resolved.  Will continue to monitor. ?  ?May have therapeutic grounds pass with husband, needs WC ?LOS: ?5 days ?A FACE TO FACE EVALUATION WAS PERFORMED ? ?Tressia MinersLisa Itzabella Sorrels, NP ?05/13/2021, 9:48 AM  ? ?  ?

## 2021-05-13 NOTE — Progress Notes (Signed)
Physical Therapy TBI Note ? ?Patient Details  ?Name: Peggy Vega ?MRN: [id number] ?Date of Birth: 1971/01/18 ? ?Today's Date: 05/13/2021 ?PT Individual Time: 1100-1140 ?PT Individual Time Calculation (min): 40 min  ? ?Short Term Goals: ?Week 1:  PT Short Term Goal 1 (Week 1): STG=LTGs ? ?Skilled Therapeutic Interventions/Progress Updates:  ?   ?Patient sitting up in bed with her husband and daughter in the room upon PT arrival. Patient alert and agreeable to PT session. Patient reported 5/10 headache pain during session, reports she just received medication. PT provided repositioning, rest breaks, and distraction as pain interventions throughout session.  ? ?Patient's family present for family education session. Provided education on TBI symptoms and management, balance deficits, maintaining a daily routine, removal of weapons from the home, consequences of alcohol/substance use following TBI, fall risk/prevention, home modifications to prevent falls, and activation of emergency services in the event of a fall with injury, LOC, or hit to the head during session. Patient and family attentive and receptive to all education provided. ? ?Patient's husband and daughter demonstrated safe guarding technique with all mobility below throughout session.  ? ?Therapeutic Activity: ?Bed Mobility: Patient performed rolling R/L and supine to/from sit independently in a flat bed without use of bed rails.  ?Transfers: Patient performed sit to/from stand independently from various household surfaces throughout session.  ?Patient performed a simulated low trunk height car transfer independently without an AD.  ? ?Gait Training:  ?Patient ambulated throughout the unit (3x 100-300 ft) without an AD with supervision-mod I. Ambulated with increased BOS, mild trendelenburg gait, and reduced trunk rotation.  ?Patient ambulated up/down a ramp, over 10 feet of mulch (unlevel surface), and up/down a curb to simulate community ambulation  over unlevel surfaces with supervision without an AD. Provided cues for safe guarding technique and education on potential distractions and changes in surfaces outdoors that may increase fall risk. ?Patient ascended/descended 16x6" steps using R rail with supervision. Performed reciprocal gait pattern throughout. Provided cues for use of step-to gait pattern on descent with increased L knee pain/buckling and educated on fall prevention/recovery on the stairs.  ? ?Provided patient with information on TBI, [place] TBI book, and information on [facility].  ? ?Patient sitting up in bed with her daughter and husband at bedside at end of session with breaks locked and all needs within reach. Patient and family without further questions and feeling confident for d/c tomorrow.  ? ?Therapy Documentation ?Precautions:  ?Precautions ?Precautions: Fall ?Restrictions ?Weight Bearing Restrictions: No ?Agitated Behavior Scale: ?TBI ?Observation Details ?Observation Environment: CIR ?Start of observation period - Date: 05/13/21 ?Start of observation period - Time: 1100 ?End of observation period - Date: 05/13/21 ?End of observation period - Time: 1140 ?Agitated Behavior Scale (DO NOT LEAVE BLANKS) ?Short attention span, easy distractibility, inability to concentrate: Absent ?Impulsive, impatient, low tolerance for pain or frustration: Absent ?Uncooperative, resistant to care, demanding: Absent ?Violent and/or threatening violence toward people or property: Absent ?Explosive and/or unpredictable anger: Absent ?Rocking, rubbing, moaning, or other self-stimulating behavior: Absent ?Pulling at tubes, restraints, etc.: Absent ?Wandering from treatment areas: Absent ?Restlessness, pacing, excessive movement: Absent ?Repetitive behaviors, motor, and/or verbal: Absent ?Rapid, loud, or excessive talking: Absent ?Sudden changes of mood: Absent ?Easily initiated or excessive crying and/or laughter: Absent ?Self-abusiveness, physical and/or  verbal: Absent ?Agitated behavior scale total score: 14 ? ? ? ? ?Therapy/Group: Individual Therapy ? ?Helayne Seminole PT, DPT ? ?05/13/2021, 12:33 PM  ?

## 2021-05-13 NOTE — Progress Notes (Signed)
Occupational Therapy TBI Note ? ?Patient Details  ?Name: Peggy Vega ?MRN: [id number] ?Date of Birth: 23-Mar-1970 ? ?Today's Date: 05/13/2021 ?OT Individual Time: 0900-1000 ?OT Individual Time Calculation (min): 60 min  ? ? ?Short Term Goals: ?Week 1:  OT Short Term Goal 1 (Week 1): STG=LTG d/t ELOS ? ?Skilled Therapeutic Interventions/Progress Updates:  ?  Family education session: husband and daughter present with pt at beginning of session. OT reviews recommendation of 24/7 supervison initially and supervision for IADLs for the first few trials and if no safety issues come up then fading away superviison with guidance of OP services. OT reiterates education with written handouts provided on siezure risk, balance issues, no driving or riding a bike initially, stress management, head aches, and [employer] yoga/mindfulness program as a way to safely get back into a favorite leisure activity of yoga. Reiterated that would not do deep inversions (down dog) d/t increased in intercranial pressure or staggered stance/heel alignment poses (warrior 2) as pt with increased lateral sway with these poses increasing pt risk for falling. Pt able to compete floor transfer on/off ground roll onto belly/back and get up through quadroped using the EOM to stand back up. Pt able ot reporting mild dizziness with floor transfer requiring increased time to resolve. Pt completes simple yoga practice to move through familiar poses that are safe for TBI as outlined by the [employer] yoga program for TBI survivors- childs pose with a mat, tabletop, cat/cow, thread the needle (thoracic twist), low lunge, warrior 1>warrior 2 (needed CGA for increased lateral sway- completed to improve awareness to balance deficits), standing tree at the wall (kickstand option). Provided education on eventualy grading task up by using blocks underneath hands instead of ottoman at foot of bed when cleared by neuro MD and using PT to  guide recovery to return to safe exercise. Reiterated to not complete heated yoga as temp regulation can be much more sensitive after TBI and to continue to use OT/PT/MD to guide when it is safe to return to that variation of yoga. Pt verbalized she should start slow with sessions ~10 min of yoga at a time and weekly add another 5 to improve building fatigue, activity tolerance and balance instead of jumping back into hour long classes. Provided all of this information to family as well. Exited session with pt seated in bed family in room and call light in reach ? ? ? ?Therapy Documentation ?Precautions:  ?Precautions ?Precautions: Fall ?Restrictions ?Weight Bearing Restrictions: No ? ?Therapy/Group: Individual Therapy ? ?Lowella Dell Demaria Deeney ?05/13/2021, 6:49 AM ?

## 2021-05-14 ENCOUNTER — Other Ambulatory Visit (HOSPITAL_COMMUNITY): Payer: Self-pay

## 2021-05-14 DIAGNOSIS — F1010 Alcohol abuse, uncomplicated: Secondary | ICD-10-CM

## 2021-05-14 DIAGNOSIS — S069X9D Unspecified intracranial injury with loss of consciousness of unspecified duration, subsequent encounter: Secondary | ICD-10-CM

## 2021-05-14 DIAGNOSIS — S0291XD Unspecified fracture of skull, subsequent encounter for fracture with routine healing: Secondary | ICD-10-CM

## 2021-05-14 DIAGNOSIS — K5901 Slow transit constipation: Secondary | ICD-10-CM

## 2021-05-14 MED ORDER — ENSURE ENLIVE PO LIQD: 237.0000 mL | Freq: Two times a day (BID) | ORAL | 12 refills | Status: AC

## 2021-05-14 MED ORDER — POLYETHYLENE GLYCOL 3350 17 G PO PACK: 17.0000 g | PACK | Freq: Two times a day (BID) | ORAL | 0 refills | Status: AC

## 2021-05-14 MED ORDER — PLECANATIDE 3 MG PO TABS: 3.0000 mg | ORAL_TABLET | Freq: Every day | ORAL | Status: AC

## 2021-05-14 MED ORDER — LEVETIRACETAM 1000 MG PO TABS: 1000.0000 mg | ORAL_TABLET | Freq: Two times a day (BID) | ORAL | 0 refills | Status: AC

## 2021-05-14 MED ORDER — ACETAMINOPHEN 325 MG PO TABS
650.0000 mg | ORAL_TABLET | ORAL | Status: AC | PRN
Start: 2021-05-14 — End: ?

## 2021-05-14 MED ORDER — TOPIRAMATE 25 MG PO TABS: 25.0000 mg | ORAL_TABLET | Freq: Every day | ORAL | 0 refills | Status: AC

## 2021-05-14 MED ORDER — TRAZODONE HCL 50 MG PO TABS: 25.0000 mg | ORAL_TABLET | Freq: Every evening | ORAL | 0 refills | Status: AC | PRN

## 2021-05-14 MED ORDER — FOLIC ACID 1 MG PO TABS
1.0000 mg | ORAL_TABLET | Freq: Every day | ORAL | 0 refills | Status: AC
Start: 2021-05-14 — End: ?

## 2021-05-14 MED ORDER — LEVETIRACETAM 1000 MG PO TABS: 1000.0000 mg | ORAL_TABLET | Freq: Two times a day (BID) | ORAL | 0 refills | Status: DC

## 2021-05-14 MED ORDER — HYDROCODONE-ACETAMINOPHEN 5-325 MG PO TABS: 1.0000 | ORAL_TABLET | ORAL | 0 refills | Status: AC | PRN

## 2021-05-14 MED ORDER — LIDOCAINE 5 % EX PTCH: 1.0000 | MEDICATED_PATCH | CUTANEOUS | 0 refills | Status: AC

## 2021-05-14 MED ORDER — HYDROCODONE-ACETAMINOPHEN 5-325 MG PO TABS: 1.0000 | ORAL_TABLET | ORAL | 0 refills | Status: DC | PRN

## 2021-05-14 MED ORDER — SENNOSIDES-DOCUSATE SODIUM 8.6-50 MG PO TABS: 1.0000 | ORAL_TABLET | Freq: Every evening | ORAL | Status: AC | PRN

## 2021-05-14 NOTE — Progress Notes (Signed)
Pt discharged home with family. Discharge instructions reviewed with patient by Dois Davenport, [place]. Pt and family verbalize understanding. Home medication picked up from pharmacy and delivered to patient.  ?

## 2021-05-14 NOTE — Progress Notes (Signed)
Inpatient Rehabilitation Care Coordinator ?Discharge Note  ? ?Patient Details  ?Name: Peggy Vega ?MRN: [id number] ?Date of Birth: Sep 22, 1970 ? ? ?Discharge location: D/c to home ? ?Length of Stay: 5 days ? ?Discharge activity level: Supervision ? ?Home/community participation: Limited ? ?Patient response [id number] Literacy - How often do you need to have someone help you when you read instructions, pamphlets, or other written material from your doctor or pharmacy?: Never ? ?Patient response [id number] Isolation - How often do you feel lonely or isolated from those around you?: Never ? ?Services provided included: MD, RD, PT, OT, SLP, RN, CM, TR, Pharmacy, Neuropsych, SW ? ?Financial Services:  ?Charity fundraiser Utilized: Private Insurance ?BCBS Teacher, music ? ?Choices offered to/list presented to: Yes ? ?Follow-up services arranged:  ?Outpatient ?   ?Outpatient Servicies: [facility] for PT/OT/SLP ? ? ?Patient response to transportation need: ?Is the patient able to respond to transportation needs?: Yes ?In the past 12 months, has lack of transportation kept you from medical appointments or from getting medications?: No ?In the past 12 months, has lack of transportation kept you from meetings, work, or from getting things needed for daily living?: No ? ?Comments (or additional information): ? ?Patient/Family verbalized understanding of follow-up arrangements:  Yes ? ?Individual responsible for coordination of the follow-up plan: contact pt or pt husband [name] [phone] ? ?Confirmed correct DME delivered: Rana Snare 05/14/2021   ? ?Rana Snare ?

## 2021-05-14 NOTE — Progress Notes (Signed)
?                                                       PROGRESS NOTE ? ? ?Subjective/Complaints:  ?Headaches better with topamax. Anxious because her husband overslept this morning ? ?ROS: Patient denies fever, rash, sore throat, blurred vision, dizziness, nausea, vomiting, diarrhea, cough, shortness of breath or chest pain, joint or back/neck pain,   or mood change.  ? ?  ?No results found. ?No results for input(s): WBC, HGB, HCT, PLT in the last 72 hours. ? ?No results for input(s): NA, K, CL, CO2, GLUCOSE, BUN, CREATININE, CALCIUM in the last 72 hours. ? ? ?Intake/Output Summary (Last 24 hours) at 05/14/2021 0846 ?Last data filed at 05/13/2021 2015 ?Gross per 24 hour  ?Intake 478 ml  ?Output --  ?Net 478 ml  ? ?  ? ?  ? ?Physical Exam: ?Vital Signs ?Blood pressure 113/70, pulse 86, temperature 98.3 ?F (36.8 ?C), temperature source Oral, resp. rate 18, height 5\' 4"  (1.626 m), weight 72.7 kg, SpO2 97 %. ? ?Physical Exam ?Constitutional:   ?   Appearance: Normal appearance.  ?HENT:  ?   Head: Normocephalic and atraumatic.  ?   Nose: Nose normal.  ?   Mouth/Throat:  ?   Mouth: Mucous membranes are moist.  ?Eyes:  ?   Pupils: Pupils are equal, round, and reactive to light.  ?Cardiovascular:  ?   Rate and Rhythm: Normal rate and regular rhythm.  ?   Pulses: Normal pulses.  ?   Heart sounds: Normal heart sounds.  ?Pulmonary:  ?   Effort: Pulmonary effort is normal.  ?Abdominal:  ?   General: Abdomen is flat.  ?   Palpations: Abdomen is soft.  ?Musculoskeletal:     ?   General: Normal range of motion.  ?   Cervical back: Normal range of motion and neck supple. No tenderness.  ?Skin: ?   General: Skin is warm and dry.  ?   Capillary Refill: Capillary refill takes less than 2 seconds.  ?   Comments: Healing wound on occiput  ?Neurological:  ?   Mental Status: She is alert and oriented to person, place, and time.  ?Psychiatric:     ?   Mood and Affect: Mood normal.  ?  ?  ? ?Assessment/Plan: ?1. Functional deficits which  require 3+ hours per day of interdisciplinary therapy in a comprehensive inpatient rehab setting. ?Physiatrist is providing close team supervision and 24 hour management of active medical problems listed below. ?Physiatrist and rehab team continue to assess barriers to discharge/monitor patient progress toward functional and medical goals ? ?Care Tool: ? ?Bathing ?   ?Body parts bathed by patient: Right arm, Left arm, Chest, Abdomen, Front perineal area, Buttocks, Right upper leg, Left upper leg, Right lower leg, Left lower leg, Face  ?   ?  ?  ?Bathing assist Assist Level: Independent ?  ?  ?Upper Body Dressing/Undressing ?Upper body dressing   ?What is the patient wearing?: Pull over shirt ?   ?Upper body assist Assist Level: Independent ?   ?Lower Body Dressing/Undressing ?Lower body dressing ? ? ?   ?What is the patient wearing?: Underwear/pull up, Pants ? ?  ? ?Lower body assist Assist for lower body dressing: Independent ?   ? ?Toileting ?Toileting    ?  Toileting assist Assist for toileting: Independent ?  ?  ?Transfers ?Chair/bed transfer ? ?Transfers assist ?   ? ?Chair/bed transfer assist level: Independent ?  ?  ?Locomotion ?Ambulation ? ? ?Ambulation assist ? ?   ? ?Assist level: Supervision/Verbal cueing ?Assistive device: No Device ?Max distance: 1260  ? ?Walk 10 feet activity ? ? ?Assist ?   ? ?Assist level: Independent ?Assistive device: No Device  ? ?Walk 50 feet activity ? ? ?Assist   ? ?Assist level: Supervision/Verbal cueing ?Assistive device: No Device  ? ? ?Walk 150 feet activity ? ? ?Assist   ? ?Assist level: Supervision/Verbal cueing ?Assistive device: No Device ?  ? ?Walk 10 feet on uneven surface  ?activity ? ? ?Assist   ? ? ?Assist level: Supervision/Verbal cueing ?   ? ?Wheelchair ? ? ? ? ?Assist Is the patient using a wheelchair?: No ?  ?  ? ?  ?   ? ? ?Wheelchair 50 feet with 2 turns activity ? ? ? ?Assist ? ?  ?  ? ? ?   ? ?Wheelchair 150 feet activity  ? ? ? ?Assist ?   ? ? ?   ? ?Blood  pressure 113/70, pulse 86, temperature 98.3 ?F (36.8 ?C), temperature source Oral, resp. rate 18, height 5\' 4"  (1.626 m), weight 72.7 kg, SpO2 97 %. ? ?Medical Problem List and Plan: ?1. 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            -dc home today, outpt therapies ? -[facility], NS f/u ? -discussed brain injury recovery, drinking risks ? -pt has been advised not to return to work or to drive ?2.  Antithrombotics:-DVT/anticoagulation:  Mechanical: Sequential compression devices, below knee Bilateral lower extremities ?            -antiplatelet therapy: None ?3. Pain Management: Tylenol, hydrocodone, Robaxin as needed ? -dc home on topamax for headaches 25mg  qhs ?4. Mood: LCSW to evaluate and provide emotional support ?            -Continue Zoloft 100 mg daily ?            -antipsychotic agents: n/a ?5. Neuropsych: This patient is mostly capable of making decisions on her own behalf. But has decreased insight into deficits ?6. Skin/Wound Care: Routine skin care checks ?7. Fluids/Electrolytes/Nutrition: Routine I's and O's and follow-up chemistries ?8. Hyponatremia: s/p TBI improving from admission 3/26 Na= 117, 3/31 Na= 132  ?9: Tonic-clonic seizure/TBI: Continue Keppra 1000 mg twice daily ?10: Alcohol abuse: Provide cessation counseling, associated risks ?11: Hypokalemia: KCl 40 mEq daily x 2 doses; resolved, continue weekly BMP ?12. Impaired balance-PT and OT for treatment  ?13. Constipation- not clear when had LBM.  Last reported BM 05/06/21 ? 4/3 Patient reports BM yesterday (05/11/21) after taking home plecanatide/Trulance. ?4/4 Constipation resolved. ?  ?  ?  ?LOS: ?6 days ?A FACE TO FACE EVALUATION WAS PERFORMED ? ?07/11/21, NP ?05/14/2021, 8:46 AM  ? ?  ?

## 2021-06-04 ENCOUNTER — Encounter: Payer: Federal, State, Local not specified - PPO | Admitting: Registered Nurse

## 2021-06-05 ENCOUNTER — Encounter: Payer: Self-pay | Admitting: Registered Nurse

## 2021-06-05 ENCOUNTER — Encounter: Payer: Federal, State, Local not specified - PPO | Attending: Registered Nurse | Admitting: Registered Nurse

## 2021-06-05 VITALS — BP 108/70 | HR 83 | Ht 64.0 in | Wt 163.6 lb

## 2021-06-05 DIAGNOSIS — S0291XD Unspecified fracture of skull, subsequent encounter for fracture with routine healing: Secondary | ICD-10-CM

## 2021-06-05 DIAGNOSIS — S06364A Traumatic hemorrhage of cerebrum, unspecified, with loss of consciousness of 6 hours to 24 hours, initial encounter: Secondary | ICD-10-CM

## 2021-06-05 DIAGNOSIS — F1010 Alcohol abuse, uncomplicated: Secondary | ICD-10-CM | POA: Insufficient documentation

## 2021-06-05 DIAGNOSIS — S069X9D Unspecified intracranial injury with loss of consciousness of unspecified duration, subsequent encounter: Secondary | ICD-10-CM

## 2021-06-05 NOTE — Progress Notes (Signed)
? ?Subjective:  ? ? Patient ID: Peggy Vega, female    DOB: 1970-07-08, 51 y.o.   MRN: [id number] ? ?HPI: Peggy Vega is a 51 y.o. female who returns for hospital follow up appointment of her TBI and ICH. She was brought to [facility] on 05/04/2021.  ?Dr Conchita Paris H&P 05/04/2021 ?Peggy Vega is a 51 y.o. female brought into the emergency department by her husband after suffering a fall.  Patient was apparently out, somewhat intoxicated and was walking and fell backwards hitting the back of her head.  She apparently lost consciousness and had a possiblewitnessed seizure in which she was "shaking" for a few seconds.  Patient was therefore brought to the emergency department.  Upon her arrival she was awake, moving all extremities, although not following commands or interactive verbally.  CT scan was completed demonstrating an intraparenchymal hematoma.  Neurosurgical consultation was therefore requested. ?  ?Of note, the patient's husband does not report any history of medical problems denying HTN, DM, other endocrine disorders, hypothyroidism, heart disease or stroke. He only knows of Zoloft as a daily rx med.  ? ?CT Head WO Contrast: CT Cervical Spine  ?IMPRESSION: ?1. Large hemorrhagic contusion within the right frontal pole, ?measuring 3.2 x 1.6 cm. ?2. Nondisplaced right occipital skull fracture with large right ?posterior scalp hematoma. ?3. Motion degraded cervical spine images, but no visible acute ?fracture or static subluxation of the cervical spine ? ?She was admitted to inpatient Rehabilitation on 04/28/2021 and discharged home on 05/14/2021.  ?She is receiving outpatient therapy at [facility]. She denies any pain. She rates her pain on Health and History 5.  ?Also reports she has a good appetite.  ? ?Husband in room. ? ?She lives 31/2 hours away and states she seen Dr. Conchita Paris today and has a follow u appointment scheduled in 6 weeks. She will F/U  with Dr. Natale Lay in 6 weeks, she verbalizes understanding.   ? ?Pain Inventory ?Average Pain 5 ?Pain Right Now 5 ?My pain is intermittent and aching ? ?LOCATION OF PAIN  head, neck ? ?BOWEL ?Number of stools per week: 2-3 ?Oral laxative use Yes  ?Type of laxative Trulance 3 mg ? ? ?BLADDER ?Normal ? ? ? ?Mobility ?walk without assistance ?ability to climb steps?  yes ?do you drive?  no ? 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 ?Neuro/Psych ?numbness ?tingling ?trouble walking ?dizziness ?confusion ?depression ? ?Prior Studies ?Any changes since last visit?  no ? ?Physicians involved in your care ?Any changes since last visit?  no ? ? ?History reviewed. No pertinent family history. ?Social History  ? ?Socioeconomic History  ? Marital status: Married  ?  Spouse name: Not on file  ? Number of children: Not on file  ? Years of education: Not on file  ? Highest education level: Not on file  ?Occupational History  ? Not on file  ?Tobacco Use  ? Smoking status: Never  ? Smokeless tobacco: Never  ?Vaping Use  ? Vaping Use: Never used  ?Substance and Sexual Activity  ? Alcohol use: Yes  ? Drug use: Never  ? Sexual activity: Yes  ?Other Topics Concern  ? Not on file  ?Social History Narrative  ? Not on file  ? ?Social Determinants of Health  ? ?Financial Resource Strain: Not on file  ?Food Insecurity: Not on file  ?Transportation Needs: Not on file  ?  Physical Activity: Not on file  ?Stress: Not on file  ?Social Connections: Not on file  ? ?History reviewed. No pertinent surgical history. ?Past Medical History:  ?Diagnosis Date  ? Depression   ? ETOH abuse   ? Hyponatremia   ? Seizure (HCC)   ? ?BP 108/70   Pulse 83   Ht 5\' 4"  (1.626 m)   Wt 163 lb 9.6 oz (74.2 kg)   LMP  (LMP Unknown)   SpO2 98%   BMI 28.08 kg/m?  ? ?Opioid Risk Score:   ?Fall Risk Score:  `1 ? ?Depression screen PHQ 2/9 ? ? ?  06/05/2021  ?  1:53 PM  ?Depression  screen PHQ 2/9  ?Decreased Interest 3  ?Down, Depressed, Hopeless 2  ?PHQ - 2 Score 5  ?Altered sleeping 2  ?Tired, decreased energy 2  ?Change in appetite 1  ?Feeling bad or failure about yourself  1  ?Trouble concentrating 1  ?Moving slowly or fidgety/restless 2  ?Suicidal thoughts 0  ?PHQ-9 Score 14  ?  ? ?Review of Systems  ?Constitutional: Negative.   ?HENT: Negative.    ?Eyes: Negative.   ?Respiratory:  Positive for cough.   ?Cardiovascular: Negative.   ?Gastrointestinal:  Positive for constipation.  ?Endocrine: Negative.   ?Genitourinary: Negative.   ?Musculoskeletal:  Positive for gait problem and neck pain.  ?Skin: Negative.   ?Allergic/Immunologic: Negative.   ?Neurological:  Positive for dizziness, numbness and headaches.  ?Hematological: Negative.   ?Psychiatric/Behavioral:  Positive for confusion and dysphoric mood.   ? ?   ?Objective:  ? Physical Exam ?Vitals and nursing note reviewed.  ?Constitutional:   ?   Appearance: Normal appearance.  ?Cardiovascular:  ?   Rate and Rhythm: Normal rate and regular rhythm.  ?   Pulses: Normal pulses.  ?   Heart sounds: Normal heart sounds.  ?Pulmonary:  ?   Effort: Pulmonary effort is normal.  ?   Breath sounds: Normal breath sounds.  ?Musculoskeletal:  ?   Cervical back: Normal range of motion and neck supple.  ?   Comments: Normal Muscle Bulk and Muscle Testing Reveals:  ?Upper Extremities: Full ROM and Muscle Strength 5/5 ?Lower Extremities: Full ROM and Muscle Strength 5/5 ?Arise from Table with ease ?Narrow Based  Gait  ?   ?Skin: ?   General: Skin is warm and dry.  ?Neurological:  ?   Mental Status: She is alert and oriented to person, place, and time.  ?Psychiatric:     ?   Mood and Affect: Mood normal.     ?   Behavior: Behavior normal.  ? ? ? ? ?   ?Assessment & Plan:  ?TBI/ICH: Continue Outpatient Therapy with [facility]. Dr [facility] Following. Continue to Monitor.  ?Alcohol Abuse: She denies any alcohol use since discharge.  Continue to Monitor.  ? ?F/U with Dr. Conchita Paris in 6 weeks  ? ?

## 2021-07-17 ENCOUNTER — Ambulatory Visit: Payer: Federal, State, Local not specified - PPO | Admitting: Physical Medicine & Rehabilitation

## 2023-03-16 IMAGING — CT CT HEAD W/O CM
4 of 10 series · 16 of 47 positions shown, 18 images · non-contrast
Comparison: CT head obtained earlier the same day

CLINICAL DATA: Head trauma, altered mental status



[Series 3: head 2.0 h70h · axial · 0.52mm/px · z∈[-80,+28]mm · 7 of 72 slices shown, 9 images (1 of 2)]
[im 9/72  brain]
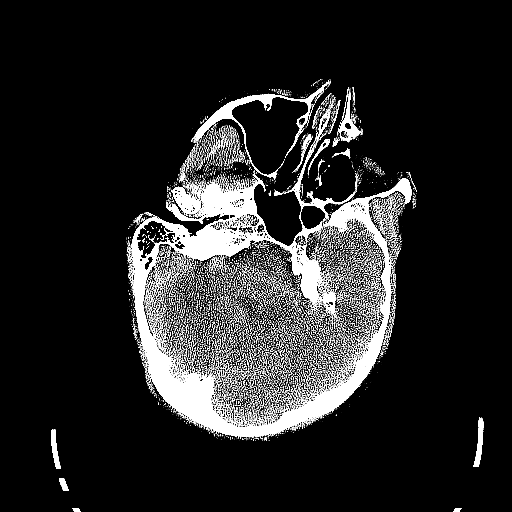
[im 9/72  bone]
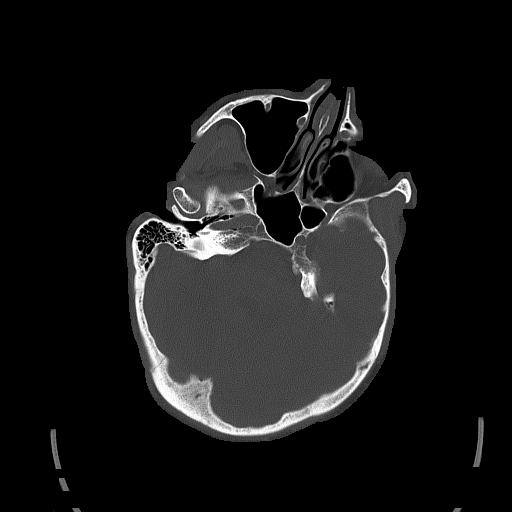
[im 18/72  brain]
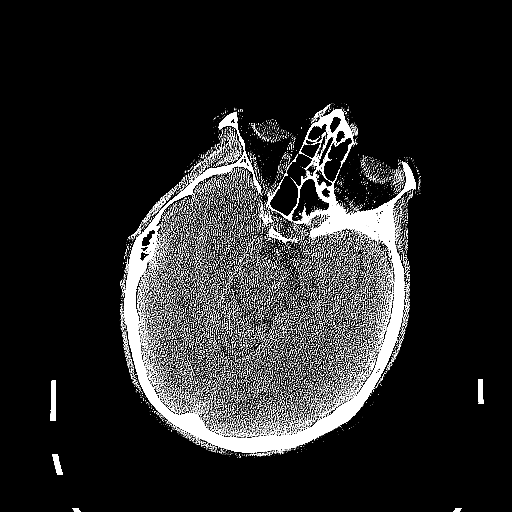
[im 27/72  brain]
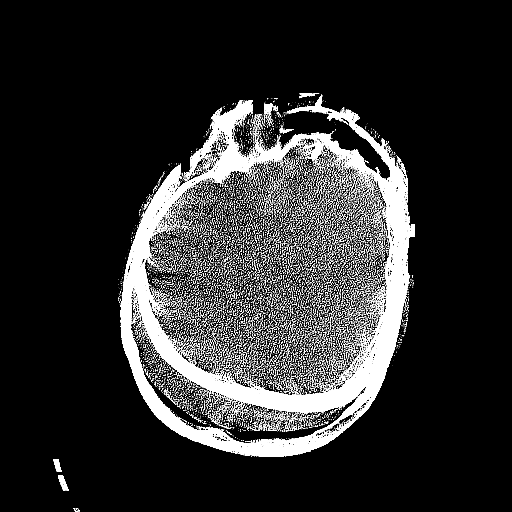
[im 36/72  brain]
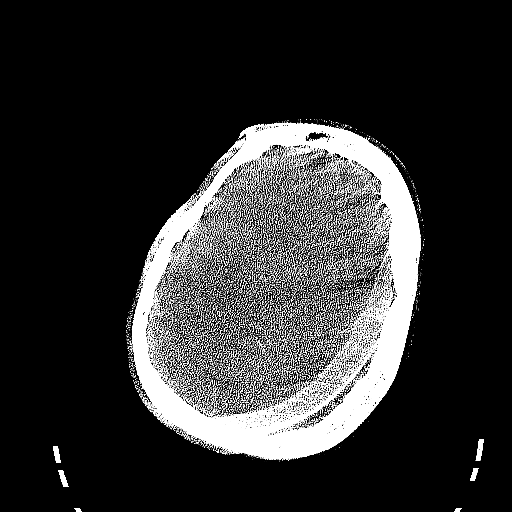
[im 45/72  brain]
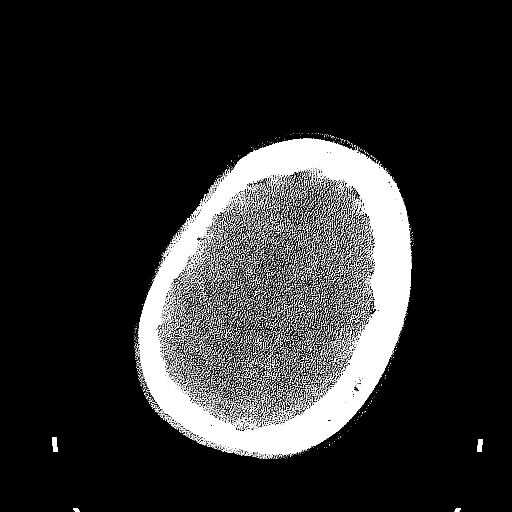
[im 45/72  bone]
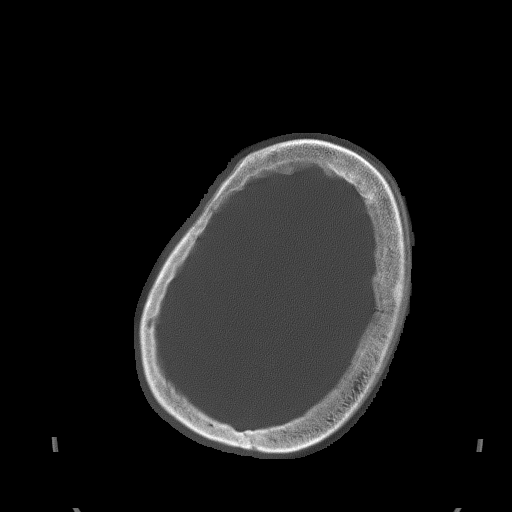
[im 54/72  brain]
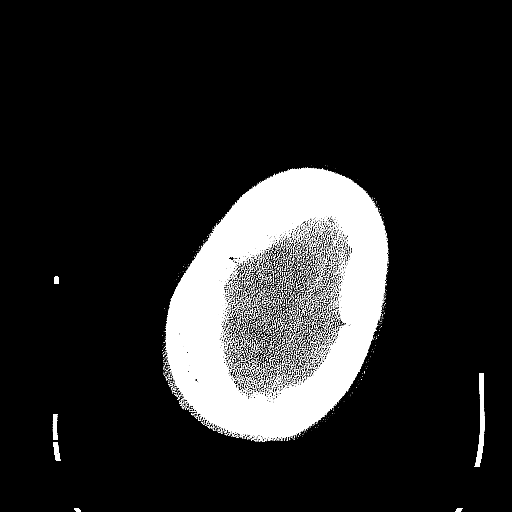
[im 63/72  brain]
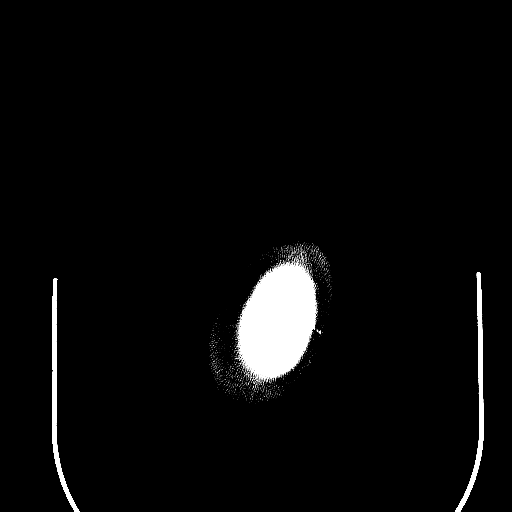

[Series 4: head 2.0 h70h · axial · 0.52mm/px · z∈[-113,-73]mm · 3 of 69 slices shown (2 of 2)]
[im 10/69  brain]
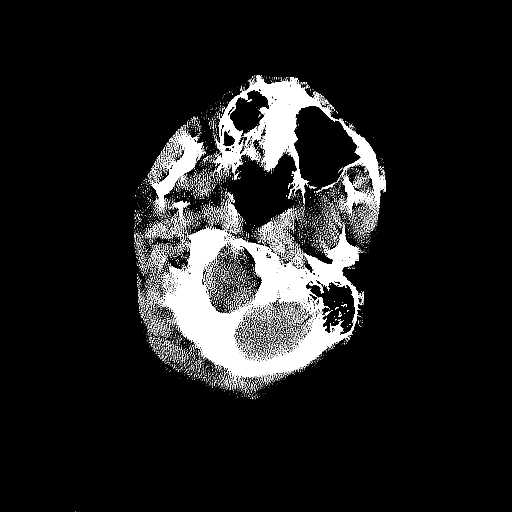
[im 20/69  brain]
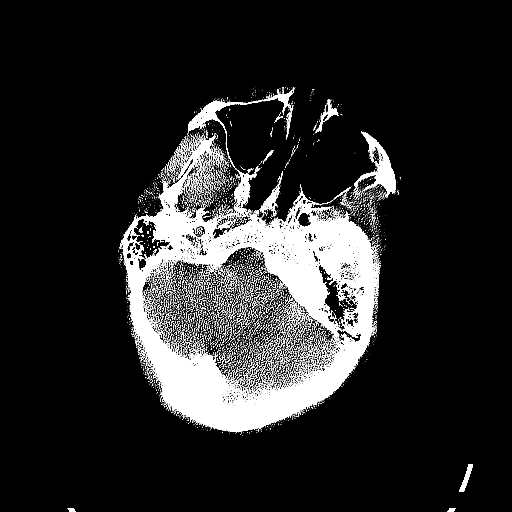
[im 30/69  brain]
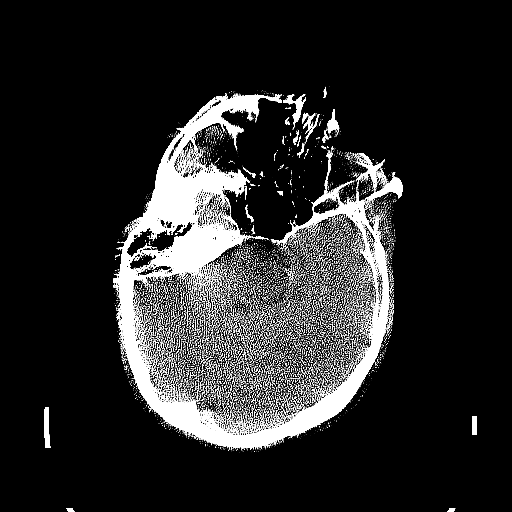

[Series 9: head 3.0 mpr cor · coronal · 0.32mm/px · 3 of 68 slices shown]
[im 18/68  brain]
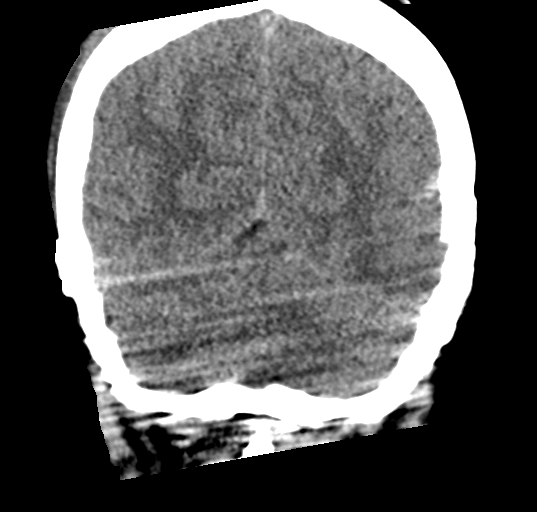
[im 34/68  brain]
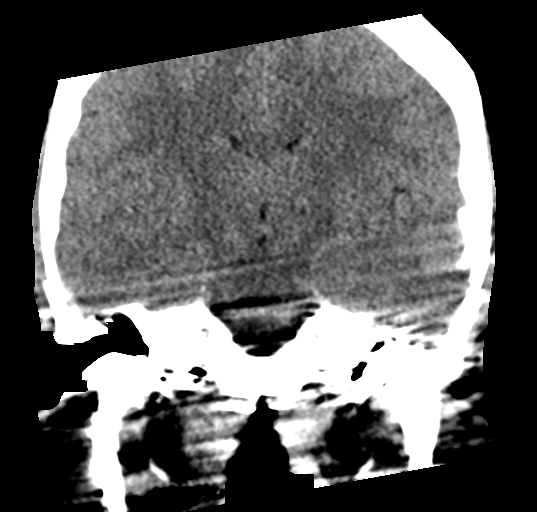
[im 50/68  brain]
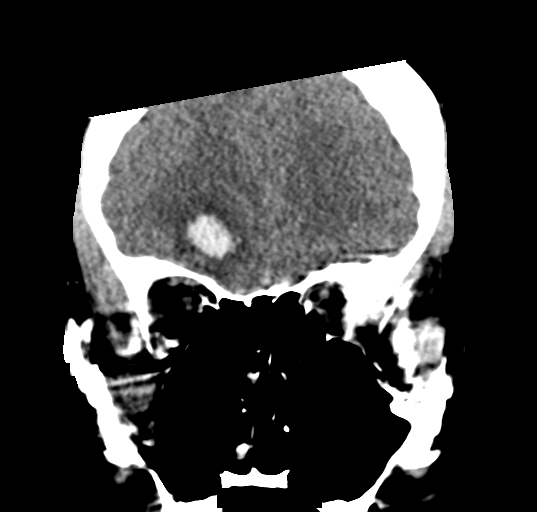

[Series 10: head 3.0 mpr sag · sagittal · 0.34mm/px · 3 of 56 slices shown]
[im 11/56  brain]
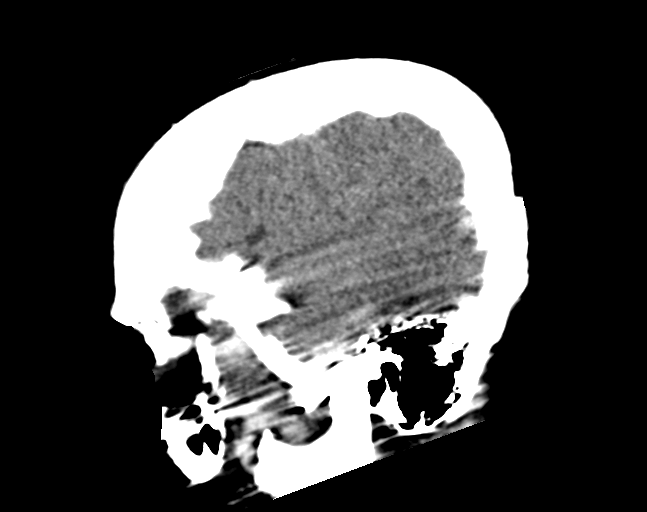
[im 22/56  brain]
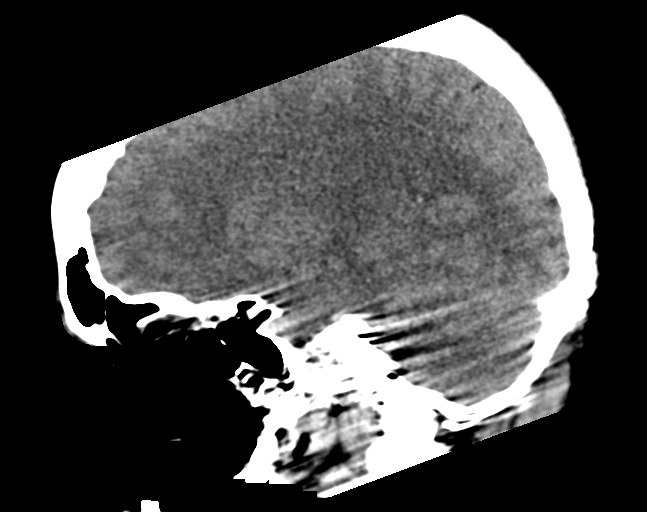
[im 33/56  brain]
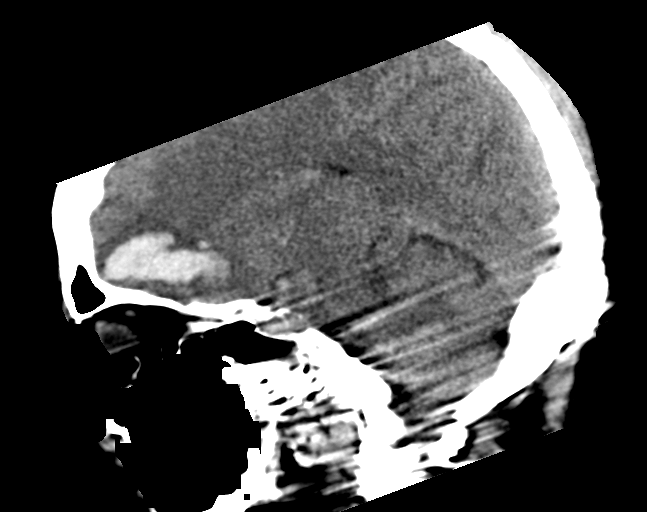

[16 of 47 positions shown; findings below may reference images not displayed]

FINDINGS: Image quality is degraded by motion artifact.

Brain: The intraparenchymal hematoma centered in the right frontal
lobe measuring up to 4.3 cm AP x 1.8 cm cc in the sagittal plane is
not significantly changed in size when remeasured using similar
technique. Smaller adjacent hemorrhage in the gyrus rectus is also
not significantly changed (8-18, 10-30). There is mild surrounding
edema without significant mass effect or midline shift.

There is no new acute intracranial hemorrhage. There is no evidence
of territorial infarct. The ventricles are stable in size.

There is no midline shift.

Vascular: No hyperdense vessel or unexpected calcification.

Skull: A nondisplaced right occipital skull fracture is again seen.

Sinuses/Orbits: The paranasal sinuses are clear. The globes and
orbits are unremarkable.

Other: Is significant soft tissue swelling over the right occiput.
IMPRESSION: 1. Significantly motion degraded exam. Within this confine, no
significant interval change in size of the intraparenchymal hematoma
centered in the right frontal lobe with mild surrounding edema but
no midline shift.
2. Unchanged nondisplaced right occipital skull fracture and
overlying scalp swelling.
